# Patient Record
Sex: Female | Born: 1990 | Hispanic: Yes | Marital: Single | Smoking: Current every day smoker
Health system: Southern US, Community
[De-identification: ages and names within clinical notes are randomized; demographics above are authoritative.]

## PROBLEM LIST (undated history)

## (undated) DIAGNOSIS — K8012 Calculus of gallbladder with acute and chronic cholecystitis without obstruction: Secondary | ICD-10-CM

## (undated) DIAGNOSIS — K219 Gastro-esophageal reflux disease without esophagitis: Secondary | ICD-10-CM

## (undated) HISTORY — PX: DILATION AND CURETTAGE OF UTERUS: SHX78

## (undated) HISTORY — DX: Calculus of gallbladder with acute and chronic cholecystitis without obstruction: K80.12

## (undated) HISTORY — DX: Gastro-esophageal reflux disease without esophagitis: K21.9

## (undated) HISTORY — PX: KNEE SURGERY: SHX244

---

## 2014-11-27 ENCOUNTER — Encounter: Payer: Self-pay | Admitting: Emergency Medicine

## 2014-11-27 ENCOUNTER — Emergency Department
Admission: EM | Admit: 2014-11-27 | Discharge: 2014-11-27 | Disposition: A | Discharge status: Home / Self Care | Payer: BLUE CROSS/BLUE SHIELD | Attending: Emergency Medicine | Admitting: Emergency Medicine

## 2014-11-27 DIAGNOSIS — Z720 Tobacco use: Secondary | ICD-10-CM | POA: Insufficient documentation

## 2014-11-27 DIAGNOSIS — R309 Painful micturition, unspecified: Secondary | ICD-10-CM | POA: Diagnosis present

## 2014-11-27 DIAGNOSIS — Z3202 Encounter for pregnancy test, result negative: Secondary | ICD-10-CM | POA: Diagnosis not present

## 2014-11-27 DIAGNOSIS — N390 Urinary tract infection, site not specified: Secondary | ICD-10-CM | POA: Insufficient documentation

## 2014-11-27 LAB — URINALYSIS COMPLETE WITH MICROSCOPIC (ARMC ONLY)
BACTERIA UA: NONE SEEN
BILIRUBIN URINE: NEGATIVE
GLUCOSE, UA: NEGATIVE mg/dL
Ketones, ur: NEGATIVE mg/dL
NITRITE: NEGATIVE
Protein, ur: 100 mg/dL — AB
Specific Gravity, Urine: 1.018 (ref 1.005–1.030)
pH: 8 (ref 5.0–8.0)

## 2014-11-27 LAB — POCT PREGNANCY, URINE: PREG TEST UR: NEGATIVE

## 2014-11-27 MED ORDER — NITROFURANTOIN MONOHYD MACRO 100 MG PO CAPS: 100.0000 mg | ORAL_CAPSULE | Freq: Two times a day (BID) | ORAL | Status: DC

## 2014-11-27 MED ORDER — PHENAZOPYRIDINE HCL 200 MG PO TABS: 200.0000 mg | ORAL_TABLET | Freq: Three times a day (TID) | ORAL | Status: DC | PRN

## 2014-11-27 NOTE — Discharge Instructions (Signed)
Infeccin urinaria  (Urinary Tract Infection)  La infeccin urinaria puede ocurrir en Corporate treasurer del tracto urinario. El tracto urinario es un sistema de drenaje del cuerpo por el que se eliminan los desechos y el exceso de [place]. El tracto urinario est formado por dos riones, dos urteres, la vejiga y Engineer, mining. Los riones son rganos que tienen forma de frijol. Cada rin tiene aproximadamente el tamao del puo. Estn situados debajo de las [place], uno a cada lado de la columna vertebral CAUSAS  La causa de la infeccin son los microbios, que son organismos microscpicos, que incluyen hongos, virus, y bacterias. Estos organismos son tan pequeos que slo pueden verse a travs del microscopio. Las bacterias son los microorganismos que ms comnmente causan infecciones urinarias.  SNTOMAS  Los sntomas pueden variar segn la edad y el sexo del paciente y por la ubicacin de la infeccin. Los sntomas en las mujeres jvenes incluyen la necesidad frecuente e intensa de orinar y una sensacin dolorosa de ardor en la vejiga o en la uretra durante la miccin. Las mujeres y los hombres mayores podrn sentir cansancio, temblores y debilidad y Futures trader musculares y Engineer, mining abdominal. Si tiene [place], puede significar que la infeccin est en los riones. Otros sntomas son dolor en la espalda o en los lados debajo de las [place], nuseas y vmitos.  DIAGNSTICO  Para diagnosticar una infeccin urinaria, el mdico le preguntar acerca de sus sntomas. Genuine Parts una [place] de [place]. La muestra de orina se analiza para Engineer, manufacturing bacterias y glbulos blancos de Risk manager. Los glbulos blancos se forman en el organismo para ayudar a Artist las infecciones.  TRATAMIENTO  Por lo general, las infecciones urinarias pueden tratarse con medicamentos. Debido a que la [employer] de las infecciones son causadas por bacterias, por lo general pueden tratarse con antibiticos. La eleccin del  antibitico y la duracin del tratamiento depender de sus sntomas y el tipo de bacteria causante de la infeccin.  INSTRUCCIONES PARA EL CUIDADO EN EL HOGAR   Si le recetaron antibiticos, tmelos exactamente como su mdico le indique. Termine el medicamento aunque se sienta mejor despus de haber tomado slo algunos.  Beba gran cantidad de lquido para mantener la orina de tono claro o color amarillo plido.  Evite la cafena, el t y las [address]. Estas sustancias irritan la vejiga.  Vaciar la vejiga con frecuencia. Evite retener la orina durante largos perodos.  Vace la vejiga antes y despus de Management consultant.  Despus de mover el intestino, las mujeres deben higienizarse la regin perineal desde adelante hacia atrs. Use slo un papel tissue por vez. SOLICITE ATENCIN MDICA SI:   Siente dolor en la espalda.  Le sube la fiebre.  Los sntomas no mejoran luego de [address]. SOLICITE ATENCIN MDICA DE INMEDIATO SI:   Siente dolor intenso en la espalda o en la zona inferior del abdomen.  Comienza a sentir escalofros.  Tiene nuseas o vmitos.  Tiene una sensacin continua de quemazn o molestias al [employer]. ASEGRESE DE QUE:   Comprende estas instrucciones.  Controlar su enfermedad.  Solicitar ayuda de inmediato si no mejora o empeora.   Esta informacin no tiene Theme park manager el consejo del mdico. Asegrese de hacerle al mdico cualquier pregunta que tenga.   Document Released: 10/22/2004 Document Revised: 10/07/2011 Elsevier Interactive Patient Education [employer]  Take the prescription meds as directed.  Increase fluid intake to flush your bladder. Take regular bathroom breaks. [facility] as needed.

## 2014-11-27 NOTE — ED Notes (Signed)
Pt alert and oriented X4, active, cooperative, pt in NAD. RR even and unlabored, color WNL.  Pt informed to return if any life threatening symptoms occur.   

## 2014-11-27 NOTE — ED Notes (Signed)
Pt to ED with c/o painful urination that started today, currently on MP

## 2014-11-27 NOTE — ED Provider Notes (Signed)
[facility] Emergency Department Provider Note ____________________________________________  Time seen: 1552  I have reviewed the triage vital signs and the nursing notes.  HISTORY  Chief Complaint  painful urination   HPI Maria Hansen is a 24 y.o. female was at the ED for evaluation of sudden onset of dysuria today. She reports pain and burning just after she empties her bladder. She does also notes some lower pelvic pain, but denies any flank pain. She also denies any fevers, chills, sweats, nausea, vomiting, or hematuria. She has dosed Tylenol intermittently for symptom relief. She reports her LMP@10 /29/16, and notes it is normal.  History reviewed. No pertinent past medical history.  There are no active problems to display for this patient.  History reviewed. No pertinent past surgical history.  Current Outpatient Rx  Name  Route  Sig  Dispense  Refill  . nitrofurantoin, macrocrystal-monohydrate, (MACROBID) 100 MG capsule   Oral   Take 1 capsule (100 mg total) by mouth 2 (two) times daily.   14 capsule   0   . phenazopyridine (PYRIDIUM) 200 MG tablet   Oral   Take 1 tablet (200 mg total) by mouth 3 (three) times daily as needed for pain.   20 tablet   0     Allergies Review of patient's allergies indicates no known allergies.  No family history on file.  Social History Social History  Substance Use Topics  . Smoking status: Current Every Day Smoker  . Smokeless tobacco: None  . Alcohol Use: Yes   Review of Systems  Constitutional: Negative for fever. Eyes: Negative for visual changes. ENT: Negative for sore throat. Cardiovascular: Negative for chest pain. Respiratory: Negative for shortness of breath. Gastrointestinal: Negative for abdominal pain, vomiting and diarrhea. Genitourinary: Positive for dysuria. Musculoskeletal: Negative for back pain. Skin: Negative for rash. Neurological: Negative for headaches, focal weakness or  numbness. ____________________________________________  PHYSICAL EXAM:  VITAL SIGNS: ED Triage Vitals  Enc Vitals Group     BP 11/27/14 1541 116/79 mmHg     Pulse Rate 11/27/14 1541 69     Resp 11/27/14 1541 18     Temp 11/27/14 1541 98.3 F (36.8 C)     Temp Source 11/27/14 1541 Oral     SpO2 11/27/14 1541 100 %     Weight 11/27/14 1541 146 lb (66.225 kg)     Height 11/27/14 1541  (1.575 m)     Head Cir --      Peak Flow --      Pain Score 11/27/14 1542 5     Pain Loc --      Pain Edu? --      Excl. in GC? --    Constitutional: Alert and oriented. Well appearing and in no distress. Head: Normocephalic and atraumatic.      Eyes: Conjunctivae are normal. PERRL. Normal extraocular movements      Ears: Canals clear. TMs intact bilaterally.   Nose: No congestion/rhinorrhea.   Mouth/Throat: Mucous membranes are moist.   Neck: Supple. No thyromegaly. Hematological/Lymphatic/Immunological: No cervical lymphadenopathy. Cardiovascular: Normal rate, regular rhythm.  Respiratory: Normal respiratory effort. No wheezes/rales/rhonchi. Gastrointestinal: Soft and nontender. No distention. Musculoskeletal: Nontender with normal range of motion in all extremities.  Neurologic:  Normal gait without ataxia. Normal speech and language. No gross focal neurologic deficits are appreciated. Skin:  Skin is warm, dry and intact. No rash noted. Psychiatric: Mood and affect are normal. Patient exhibits appropriate insight and judgment. ____________________________________________   LABS (pertinent  positives/negatives) Labs Reviewed  URINALYSIS COMPLETEWITH MICROSCOPIC ([facility] ONLY) - Abnormal; Notable for the following:    Color, Urine YELLOW (*)    APPearance TURBID (*)    Hgb urine dipstick 2+ (*)    Protein, ur 100 (*)    Leukocytes, UA 3+ (*)    Squamous Epithelial / LPF 0-5 (*)    All other components within normal limits  URINE CULTURE  POCT PREGNANCY, URINE   ____________________________________________  INITIAL IMPRESSION / ASSESSMENT AND PLAN / ED COURSE  Patient with laboratory confirmation of an acute cystitis. She will be treated with Macrobid, and phenazopyridine. Urine culture is pending. [facility]. Work note provided for today, as requested.  ____________________________________________  FINAL CLINICAL IMPRESSION(S) / ED DIAGNOSES  Final diagnoses:  UTI (lower urinary tract infection)      Lissa HoardJenise V Bacon Layman Gully, PA-C 11/27/14 1650  Emily FilbertJonathan E Williams, MD 11/27/14 2141

## 2014-11-29 LAB — URINE CULTURE
Culture: >=100000
SPECIAL REQUESTS: NORMAL

## 2015-03-05 ENCOUNTER — Emergency Department
Admission: EM | Admit: 2015-03-05 | Discharge: 2015-03-05 | Disposition: A | Discharge status: Home / Self Care | Payer: BLUE CROSS/BLUE SHIELD | Attending: Emergency Medicine | Admitting: Emergency Medicine

## 2015-03-05 ENCOUNTER — Encounter: Payer: Self-pay | Admitting: Emergency Medicine

## 2015-03-05 DIAGNOSIS — B349 Viral infection, unspecified: Secondary | ICD-10-CM

## 2015-03-05 DIAGNOSIS — Z79899 Other long term (current) drug therapy: Secondary | ICD-10-CM | POA: Insufficient documentation

## 2015-03-05 LAB — RAPID INFLUENZA A&B ANTIGENS (ARMC ONLY): INFLUENZA A (ARMC): NOT DETECTED

## 2015-03-05 LAB — RAPID INFLUENZA A&B ANTIGENS: Influenza B (ARMC): NOT DETECTED

## 2015-03-05 MED ORDER — PROMETHAZINE-DM 6.25-15 MG/5ML PO SYRP: 5.0000 mL | ORAL_SOLUTION | Freq: Four times a day (QID) | ORAL | Status: DC | PRN

## 2015-03-05 NOTE — ED Notes (Signed)
Pt to ed with c/o cough, congestion, body aches, fever x 3 days.

## 2015-03-05 NOTE — ED Notes (Signed)
Pt c/o flu like S/S for 3 days.  Pt sts nausea began today.  Pt denies SOB, CP or LOC. NAD.

## 2015-03-05 NOTE — Discharge Instructions (Signed)
Infecciones virales °(Viral Infections) °La causa de las infecciones virales son diferentes tipos de virus. La mayoría de las infecciones virales no son graves y se curan solas. Sin embargo, algunas infecciones pueden provocar síntomas graves y causar complicaciones.  °SÍNTOMAS °Las infecciones virales ocasionan:  °· Dolores de garganta. °· Molestias. °· Dolor de cabeza. °· Mucosidad nasal. °· Diferentes tipos de erupción. °· Lagrimeo. °· Cansancio. °· Tos. °· Pérdida del apetito. °· Infecciones gastrointestinales que producen náuseas, vómitos y diarrea. °Estos síntomas no responden a los antibióticos porque la infección no es por bacterias. Sin embargo, puede sufrir una infección bacteriana luego de la infección viral. Se denomina sobreinfección. Los síntomas de esta infección bacteriana son:  °· Empeora el dolor en la garganta con pus y dificultad para tragar. °· Ganglios hinchados en el cuello. °· Escalofríos y fiebre muy elevada o persistente. °· Dolor de cabeza intenso. °· Sensibilidad en los senos paranasales. °· Malestar (sentirse enfermo) general persistente, dolores musculares y fatiga (cansancio). °· Tos persistente. °· Producción mucosa con la tos, de color amarillo, verde o marrón. °INSTRUCCIONES PARA EL CUIDADO DOMICILIARIO °· Solo tome medicamentos que se pueden comprar sin receta o recetados para el dolor, malestar, la diarrea o la fiebre, como le indica el médico. °· Beba gran cantidad de líquido para mantener la orina de tono claro o color amarillo pálido. Las bebidas deportivas proporcionan electrolitos,azúcares e hidratación. °· Descanse lo suficiente y aliméntese bien. Puede tomar sopas y caldos con crackers o arroz. °SOLICITE ATENCIÓN MÉDICA DE INMEDIATO SI: °· Tiene dolor de cabeza, le falta el aire, siente dolor en el pecho, en el cuello o aparece una erupción. °· Tiene vómitos o diarrea intensos y no puede retener líquidos. °· Usted o su niño tienen una temperatura oral de más de 38,9° C  (102° F) y no puede controlarla con medicamentos. °· Su bebé tiene más de 3 meses y su temperatura rectal es de 102° F (38.9° C) o más. °· Su bebé tiene 3 meses o menos y su temperatura rectal es de 100.4° F (38° C) o más. °ESTÉ SEGURO QUE:  °· Comprende las instrucciones para el alta médica. °· Controlará su enfermedad. °· Solicitará atención médica de inmediato según las indicaciones. °  °Esta información no tiene como fin reemplazar el consejo del médico. Asegúrese de hacerle al médico cualquier pregunta que tenga. °  °Document Released: 10/22/2004 Document Revised: 04/06/2011 °Elsevier Interactive Patient Education ©2016 Elsevier Inc. ° °

## 2015-03-05 NOTE — ED Provider Notes (Signed)
[facility] Emergency Department Provider Note  ____________________________________________  Time seen: Approximately 5:09 PM  I have reviewed the triage vital signs and the nursing notes.   HISTORY  Chief Complaint Cough; Generalized Body Aches; and Fever    HPI Maria Hansen is a 25 y.o. female, NAD, presents to the ED with 2 day history of sudden onset cough, body aches, fever, chills. Notes she's had sore throat, headache and mild nausea. No episodes of emesis. Denies abdominal pain, back pain, chest pain. Notes everyone in her household is been ill and has been on antibiotics recently. Denies any exposure to strep or fluid this time. Last menstrual was 02/14/2015.   History reviewed. No pertinent past medical history.  There are no active problems to display for this patient.   History reviewed. No pertinent past surgical history.  Current Outpatient Rx  Name  Route  Sig  Dispense  Refill  . nitrofurantoin, macrocrystal-monohydrate, (MACROBID) 100 MG capsule   Oral   Take 1 capsule (100 mg total) by mouth 2 (two) times daily.   14 capsule   0   . phenazopyridine (PYRIDIUM) 200 MG tablet   Oral   Take 1 tablet (200 mg total) by mouth 3 (three) times daily as needed for pain.   20 tablet   0   . promethazine-dextromethorphan (PROMETHAZINE-DM) 6.25-15 MG/5ML syrup   Oral   Take 5 mLs by mouth 4 (four) times daily as needed for cough.   118 mL   0     Allergies Review of patient's allergies indicates no known allergies.  History reviewed. No pertinent family history.  Social History Social History  Substance Use Topics  . Smoking status: Never Smoker   . Smokeless tobacco: None  . Alcohol Use: No     Review of Systems  Constitutional: Positive fever, chills. Fatigue Eyes: No visual changes. No discharge ENT: Positive sore throat. Negative for nasal congestion, sinus pressure, ear pain. Cardiovascular: No chest  pain. Respiratory: Positive cough and chest congestion. No shortness of breath or wheezing.  Gastrointestinal: Positive nausea without vomiting. No abdominal pain.  No use in bowel habits. Genitourinary: Negative for dysuria. No hematuria. No urinary hesitancy, urgency or increased frequency. Musculoskeletal: Negative for back pain. Positive general myalgias Skin: Negative for rash. Neurological: Negative for headaches, focal weakness or numbness. 10-point ROS otherwise negative.  ____________________________________________   PHYSICAL EXAM:  VITAL SIGNS: ED Triage Vitals  Enc Vitals Group     BP 03/05/15 1657 129/78 mmHg     Pulse Rate 03/05/15 1657 85     Resp 03/05/15 1657 20     Temp 03/05/15 1657 99.2 F (37.3 C)     Temp Source 03/05/15 1657 Oral     SpO2 03/05/15 1657 100 %     Weight 03/05/15 1657 146 lb (66.225 kg)     Height 03/05/15 1657  (1.6 m)     Head Cir --      Peak Flow --      Pain Score 03/05/15 1657 8     Pain Loc --      Pain Edu? --      Excl. in GC? --     Constitutional: Alert and oriented. Well appearing and in no acute distress. Eyes: Conjunctivae are normal. PERRL. EOMI without pain.  Head: Atraumatic. ENT:      Ears: TMs visualized bilaterally without erythema, effusion, bulging, retraction. Bilateral external ear canals without swelling, erythema, discharge  Nose: No congestion/rhinnorhea.      Mouth/Throat: Mucous membranes are moist.  Neck: Upper with full range of motion Hematological/Lymphatic/Immunilogical: No cervical lymphadenopathy. Cardiovascular: Normal rate, regular rhythm. Normal S1 and S2.  Respiratory: Normal respiratory effort without tachypnea or retractions. Lungs CTAB. Neurologic:  Normal speech and language. No gross focal neurologic deficits are appreciated.  Skin:  Skin is warm, dry and intact. No rash noted. Psychiatric: Mood and affect are normal. Speech and behavior are normal. Patient exhibits appropriate  insight and judgement.   ____________________________________________   LABS (all labs ordered are listed, but only abnormal results are displayed)  Labs Reviewed  RAPID INFLUENZA A&B ANTIGENS ([facility] ONLY)   ____________________________________________  EKG  None ____________________________________________  RADIOLOGY  None ____________________________________________    PROCEDURES  Procedure(s) performed: None    Medications - No data to display   ____________________________________________   INITIAL IMPRESSION / ASSESSMENT AND PLAN / ED COURSE  Pertinent lab results that were available during my care of the patient were reviewed by me and considered in my medical decision making (see chart for details). Rapid strep and flu both returned negative.  Patient's diagnosis is consistent with viral infection. Patient will be discharged home with prescriptions for promethazine DM to decrease cough congestion as well as control any nausea. May alternate Tylenol and ibuprofen as needed for fever and aches. Advise increasing fluid intake to stay hydrated. 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 Patient is to follow up with [facility] if symptoms persist past this treatment course. Patient is given ED precautions to return to the ED for any worsening or new symptoms.    ____________________________________________  FINAL CLINICAL IMPRESSION(S) / ED DIAGNOSES  Final diagnoses:  Viral infection      NEW MEDICATIONS STARTED DURING THIS VISIT:  New Prescriptions   PROMETHAZINE-DEXTROMETHORPHAN (PROMETHAZINE-DM) 6.25-15 MG/5ML SYRUP    Take 5 mLs by mouth 4 (four) times daily as needed for cough.         Hope Pigeon, PA-C 03/05/15 1912  Myrna Blazer, MD 03/05/15 2226

## 2015-07-11 DIAGNOSIS — N96 Recurrent pregnancy loss: Secondary | ICD-10-CM | POA: Insufficient documentation

## 2016-06-28 ENCOUNTER — Emergency Department
Admission: EM | Admit: 2016-06-28 | Discharge: 2016-06-28 | Disposition: A | Discharge status: Home / Self Care | Payer: BLUE CROSS/BLUE SHIELD | Attending: Student in an Organized Health Care Education/Training Program | Admitting: Student in an Organized Health Care Education/Training Program

## 2016-06-28 ENCOUNTER — Encounter: Payer: Self-pay | Admitting: Emergency Medicine

## 2016-06-28 DIAGNOSIS — Y999 Unspecified external cause status: Secondary | ICD-10-CM | POA: Insufficient documentation

## 2016-06-28 DIAGNOSIS — Y939 Activity, unspecified: Secondary | ICD-10-CM | POA: Insufficient documentation

## 2016-06-28 DIAGNOSIS — S025XXA Fracture of tooth (traumatic), initial encounter for closed fracture: Secondary | ICD-10-CM | POA: Insufficient documentation

## 2016-06-28 DIAGNOSIS — Y929 Unspecified place or not applicable: Secondary | ICD-10-CM | POA: Insufficient documentation

## 2016-06-28 DIAGNOSIS — K0889 Other specified disorders of teeth and supporting structures: Secondary | ICD-10-CM

## 2016-06-28 DIAGNOSIS — X58XXXA Exposure to other specified factors, initial encounter: Secondary | ICD-10-CM | POA: Insufficient documentation

## 2016-06-28 MED ORDER — NAPROXEN 500 MG PO TABS: 500.0000 mg | ORAL_TABLET | Freq: Two times a day (BID) | ORAL | 0 refills | Status: AC

## 2016-06-28 MED ORDER — AMOXICILLIN 500 MG PO CAPS: 500.0000 mg | ORAL_CAPSULE | Freq: Three times a day (TID) | ORAL | 0 refills | Status: AC

## 2016-06-28 MED ORDER — HYDROCODONE-ACETAMINOPHEN 5-325 MG PO TABS
1.0000 | ORAL_TABLET | Freq: Once | ORAL | Status: AC
  Administered 2016-06-28: 1 via ORAL
  Filled 2016-06-28: qty 1

## 2016-06-28 MED ORDER — BUPIVACAINE HCL (PF) 0.5 % IJ SOLN
30.0000 mL | Freq: Once | INTRAMUSCULAR | Status: AC
  Administered 2016-06-28: 30 mL
  Filled 2016-06-28: qty 30

## 2016-06-28 NOTE — Discharge Instructions (Signed)
OPTIONS FOR DENTAL FOLLOW UP CARE ° °[place] Department of Health and Human Services - Local Safety Net Dental Clinics °http://www.ncdhhs.gov/dph/oralhealth/services/safetynetclinics.htm °  °[facility] (336-562-3123) ° °[facility] [place] ([phone]) ° °[facility] ([phone] ext [phone]) ° °[facility] ([phone]) ° °[facility] ([phone]) °This clinic caters to the indigent population and is on a lottery system. °Location: °[facility], [address], [place] °Clinic Hours: °Wednesdays from 6pm - 9pm, patients seen by a lottery system. °For dates, call or go to [email or web address]/[email or web address] °Services: °Cleanings, fillings and simple extractions. °Payment Options: °DENTAL WORK IS FREE OF CHARGE. Bring proof of income or support. °Best way to get seen: °Arrive at 5:15 pm - this is a lottery, NOT first come/first serve, so arriving earlier will not increase your chances of being seen. °  °  °[facility] °[phone] °Select option 1 for emergencies °  °Location: °[facility], [address], [place] °Clinic Hours: °No walk-ins accepted - call the day before to schedule an appointment. °Check in times are 9:30 am and 1:30 pm. °Services: °Simple extractions, temporary fillings, pulpectomy/pulp debridement, uncomplicated abscess drainage. °Payment Options: °PAYMENT IS DUE AT THE TIME OF SERVICE.  Fee is usually $100-200, additional surgical procedures (e.g. abscess drainage) may be extra. °Cash, checks, Visa/MasterCard accepted.  Can file Medicaid if patient is covered for dental - patient should call case worker to check. °No discount for [facility] patients. °Best way to get seen: °MUST call the day before and get onto the schedule. Can usually be seen the next 1-2 days. No walk-ins accepted. °  °  °[facility] °[phone] °   °Location: °[facility], [address], [place] °Clinic Hours: °M, W, Th, F 8am or 1:30pm, Tues 9a or 1:30 - first come/first served. °Services: °Simple extractions, temporary fillings, uncomplicated abscess drainage.  You do not need to be an [place] resident. °Payment Options: °PAYMENT IS DUE AT THE TIME OF SERVICE. °Dental insurance, otherwise sliding scale - bring proof of income or support. °Depending on income and treatment needed, cost is usually $50-200. °Best way to get seen: °Arrive early as it is first come/first served. °  °  °[facility] °[phone] °  °Location: °[address] °Clinic Hours: °Mon-Thu 8a-5p °Services: °Most basic dental services including extractions and fillings. °Payment Options: °PAYMENT IS DUE AT THE TIME OF SERVICE. °Sliding scale, up to 50% off - bring proof if income or support. °Medicaid with dental option accepted. °Best way to get seen: °Call to schedule an appointment, can usually be seen within 2 weeks OR they will try to see walk-ins - show up at 8a or 2p (you may have to wait). °  °  °[facility] °919-245-2435 °[place] RESIDENTS ONLY °  °Location: °[facility], [address], [place], [place] [address] °Clinic Hours: By appointment only. °Monday - Thursday 8am-5pm, Friday 8am-12pm °Services: Cleanings, fillings, extractions. °Payment Options: °PAYMENT IS DUE AT THE TIME OF SERVICE. °Cash, Visa or MasterCard. Sliding scale - $30 minimum per service. °Best way to get seen: °Come in to office, complete packet and make an appointment - need proof of income °or support monies for each household member and proof of [place] residence. °Usually takes about a month to get in. °  °  °[facility] °[phone] °  °Location: °[address]   [place] °Clinic Hours: Walk-in Urgent Care Dental Services are offered Monday-Friday  mornings only. °The numbers of emergencies accepted daily is limited to the number of °providers available. °Maximum 15 - Mondays, Wednesdays & Thursdays °Maximum 10 - Tuesdays & Fridays °Services: °You do not need to be a [place] resident to be seen for a dental emergency. °Emergencies are defined as pain, swelling, abnormal bleeding, or dental trauma. Walkins will receive x-rays if needed. °NOTE: Dental cleaning is not an emergency. °Payment Options: °PAYMENT IS DUE AT THE TIME OF SERVICE. °Minimum co-pay is $40.00 for uninsured patients. °Minimum co-pay is $3.00 for Medicaid with dental coverage. °Dental Insurance is accepted and must be presented at time of visit. °Medicare does not cover dental. °Forms of payment: Cash, credit card, checks. °Best way to get seen: °If not previously registered with the clinic, walk-in dental registration begins at 7:15 am and is on a first come/first serve basis. °If previously registered with the clinic, call to make an appointment. °  °  °[facility] °[phone] °[place] RESIDENTS ONLY °  °Location: °[address], [place], [place] °Clinic Hours: °Mon-Thu 10a-2p °Services: Extractions only! °Payment Options: °FREE (donations accepted) - bring proof of income or support °Best way to get seen: °Call and schedule an appointment OR come at 8am on the 1st Monday of every month (except for holidays) when it is first come/first served. °  °  °[facility] °[phone] °  °Location: °[address], [place] °Clinic Hours: °Friday mornings °Services, Payment Options, Best way to get seen: °Call for info °

## 2016-06-28 NOTE — ED Provider Notes (Signed)
[facility] Emergency Department Provider Note    First MD Initiated Contact with Patient 06/28/16 1739     (approximate)  I have reviewed the triage vital signs and the nursing notes.   HISTORY  Chief Complaint Dental Pain    HPI Maria Hansen is a 26 y.o. female acute severe dental pain and cheek pain. Patient states she broke her right upper tooth earlier this week. States that it is very sharp and has been irritating her cheek line. States that she's been unable to eat much due to severe hot and cold sensitivity. rates pain as 10 out of 10 in severity. No improvement with Tylenol. Denies any trouble swallowing. No shortness of breath. No trouble speaking.   History reviewed. No pertinent past medical history. History reviewed. No pertinent family history. History reviewed. No pertinent surgical history. There are no active problems to display for this patient.     Prior to Admission medications   Medication Sig Start Date End Date Taking? Authorizing Provider  nitrofurantoin, macrocrystal-monohydrate, (MACROBID) 100 MG capsule Take 1 capsule (100 mg total) by mouth 2 (two) times daily. 11/27/14   Menshew, Charlesetta IvoryJenise V Bacon, PA-C  phenazopyridine (PYRIDIUM) 200 MG tablet Take 1 tablet (200 mg total) by mouth 3 (three) times daily as needed for pain. 11/27/14   Menshew, Charlesetta IvoryJenise V Bacon, PA-C  promethazine-dextromethorphan (PROMETHAZINE-DM) 6.25-15 MG/5ML syrup Take 5 mLs by mouth 4 (four) times daily as needed for cough. 03/05/15   Hagler, Jami L, PA-C    Allergies Patient has no known allergies.    Social History Social History  Substance Use Topics  . Smoking status: Never Smoker  . Smokeless tobacco: Never Used  . Alcohol use No    Review of Systems Patient denies headaches, rhinorrhea, blurry vision, numbness, shortness of breath, chest pain, edema, cough, abdominal pain, nausea, vomiting, diarrhea, dysuria, fevers, rashes or hallucinations  unless otherwise stated above in HPI. ____________________________________________   PHYSICAL EXAM:  VITAL SIGNS: There were no vitals filed for this visit.  Constitutional: Alert and oriented. Well appearing and in no acute distress. Eyes: Conjunctivae are normal.  Head: Atraumatic. Nose: No congestion/rhinnorhea. Mouth/Throat: Mucous membranes are moist.  Rennis HardingEllis 3 fracture to tooth 1 Neck: No stridor. Painless ROM.  Cardiovascular: Normal rate, regular rhythm. Grossly normal heart sounds.  Good peripheral circulation. Respiratory: Normal respiratory effort.  No retractions. Lungs CTAB. Gastrointestinal: Soft. No distention or swelling Musculoskeletal: MAE spontaneously Neurologic:  Normal speech and language. No gross focal neurologic deficits are appreciated. No facial droop Skin:  Skin is warm, dry and intact. No rash noted. Psychiatric: Mood and affect are normal. Speech and behavior are normal.  ____________________________________________   LABS (all labs ordered are listed, but only abnormal results are displayed)  No results found for this or any previous visit (from the past 24 hour(s)). ____________________________________________ ____________________________________________   PROCEDURES  Procedure(s) performed:  Dental Block Date/Time: 06/28/2016 6:16 PM Performed by: Willy EddyOBINSON, Adriauna Campton Authorized by: Willy EddyOBINSON, Iriel Nason   Consent:    Consent obtained:  Verbal   Consent given by:  Patient   Alternatives discussed:  No treatment Indications:    Indications: dental pain and dentoalveolar trauma   Location:    Block type:  Posterior superior alveolar   Laterality:  Right Procedure details (see MAR for exact dosages):    Needle gauge:  25 G   Anesthetic injected:  Bupivacaine 0.5% w/o epi   Injection procedure:  Anatomic landmarks identified, introduced needle, incremental injection, negative  aspiration for blood and anatomic landmarks palpated Post-procedure  details:    Outcome:  Anesthesia achieved   Patient tolerance of procedure:  Tolerated well, no immediate complications      Critical Care performed: no ____________________________________________   INITIAL IMPRESSION / ASSESSMENT AND PLAN / ED COURSE  Pertinent labs & imaging results that were available during my care of the patient were reviewed by me and considered in my medical decision making (see chart for details).  DDX: abscess, dental fracture, dental caries,   Jerni Selmer is a 26 y.o. who p/w dental pain last several days as described above. No systemic symptoms. No fevers. Afebrile in ED. VSS. Exam as above. Poor dentition. Likely dental caries causing weak teeth and subsequent dental fracture with area of mucosal iritation from sharp tooth edge. No external focal drainable dental abscess identified. No evidence of Ludwig's, buccal cellulitis, mastoiditis, or airway compromise. Will treat pt with ABX, pain medication and dental referral. Dental block performed with adequate analgesia. Sharp area from tooth capped with temerrex.  Low cost dental options handout provided to pt.       ____________________________________________   FINAL CLINICAL IMPRESSION(S) / ED DIAGNOSES  Final diagnoses:  Closed fracture of tooth, initial encounter  Pain, dental      NEW MEDICATIONS STARTED DURING THIS VISIT:  New Prescriptions   No medications on file     Note:  This document was prepared using Dragon voice recognition software and may include unintentional dictation errors.    Willy Eddy, MD 06/28/16 [id number]

## 2016-06-28 NOTE — ED Triage Notes (Signed)
Dental pain on the right side "all day" pt states she has been taking Tylenol today with no relief.

## 2017-10-12 ENCOUNTER — Emergency Department
Admission: EM | Admit: 2017-10-12 | Discharge: 2017-10-12 | Disposition: A | Discharge status: Home / Self Care | Payer: Commercial Managed Care - PPO | Attending: Emergency Medicine | Admitting: Emergency Medicine

## 2017-10-12 ENCOUNTER — Emergency Department: Payer: Commercial Managed Care - PPO

## 2017-10-12 ENCOUNTER — Encounter: Payer: Self-pay | Admitting: Emergency Medicine

## 2017-10-12 ENCOUNTER — Other Ambulatory Visit: Payer: Self-pay

## 2017-10-12 ENCOUNTER — Ambulatory Visit: Payer: Commercial Managed Care - PPO | Admitting: Surgery

## 2017-10-12 ENCOUNTER — Encounter: Payer: Self-pay | Admitting: Surgery

## 2017-10-12 VITALS — BP 118/84 | HR 84 | Temp 97.7°F | Resp 12 | Ht 62.0 in | Wt 169.0 lb

## 2017-10-12 DIAGNOSIS — K811 Chronic cholecystitis: Secondary | ICD-10-CM

## 2017-10-12 DIAGNOSIS — K8020 Calculus of gallbladder without cholecystitis without obstruction: Secondary | ICD-10-CM

## 2017-10-12 DIAGNOSIS — R1010 Upper abdominal pain, unspecified: Secondary | ICD-10-CM | POA: Diagnosis not present

## 2017-10-12 DIAGNOSIS — R1013 Epigastric pain: Secondary | ICD-10-CM | POA: Diagnosis present

## 2017-10-12 DIAGNOSIS — R1011 Right upper quadrant pain: Secondary | ICD-10-CM | POA: Diagnosis not present

## 2017-10-12 DIAGNOSIS — K219 Gastro-esophageal reflux disease without esophagitis: Secondary | ICD-10-CM | POA: Diagnosis not present

## 2017-10-12 DIAGNOSIS — R52 Pain, unspecified: Secondary | ICD-10-CM

## 2017-10-12 LAB — CBC WITH DIFFERENTIAL/PLATELET
Basophils Absolute: 0.1 10*3/uL (ref 0–0.1)
Basophils Relative: 1 %
EOS ABS: 0.1 10*3/uL (ref 0–0.7)
Eosinophils Relative: 1 %
HCT: 39.5 % (ref 35.0–47.0)
HEMOGLOBIN: 13.8 g/dL (ref 12.0–16.0)
LYMPHS ABS: 2.3 10*3/uL (ref 1.0–3.6)
Lymphocytes Relative: 22 %
MCH: 29.5 pg (ref 26.0–34.0)
MCHC: 34.8 g/dL (ref 32.0–36.0)
MCV: 84.6 fL (ref 80.0–100.0)
MONOS PCT: 5 %
Monocytes Absolute: 0.5 10*3/uL (ref 0.2–0.9)
NEUTROS PCT: 73 %
Neutro Abs: 7.8 10*3/uL — ABNORMAL HIGH (ref 1.4–6.5)
Platelets: 317 10*3/uL (ref 150–440)
RBC: 4.67 MIL/uL (ref 3.80–5.20)
RDW: 13.2 % (ref 11.5–14.5)
WBC: 10.7 10*3/uL (ref 3.6–11.0)

## 2017-10-12 LAB — URINALYSIS, COMPLETE (UACMP) WITH MICROSCOPIC
BILIRUBIN URINE: NEGATIVE
Bacteria, UA: NONE SEEN
Glucose, UA: NEGATIVE mg/dL
Ketones, ur: NEGATIVE mg/dL
LEUKOCYTES UA: NEGATIVE
NITRITE: NEGATIVE
PH: 5 (ref 5.0–8.0)
Protein, ur: NEGATIVE mg/dL
SPECIFIC GRAVITY, URINE: 1.014 (ref 1.005–1.030)

## 2017-10-12 LAB — COMPREHENSIVE METABOLIC PANEL
ALK PHOS: 70 U/L (ref 38–126)
ALT: 22 U/L (ref 0–44)
ANION GAP: 5 (ref 5–15)
AST: 17 U/L (ref 15–41)
Albumin: 3.9 g/dL (ref 3.5–5.0)
BILIRUBIN TOTAL: 0.3 mg/dL (ref 0.3–1.2)
BUN: 12 mg/dL (ref 6–20)
CALCIUM: 8.5 mg/dL — AB (ref 8.9–10.3)
CO2: 27 mmol/L (ref 22–32)
Chloride: 105 mmol/L (ref 98–111)
Creatinine, Ser: 0.71 mg/dL (ref 0.44–1.00)
Glucose, Bld: 109 mg/dL — ABNORMAL HIGH (ref 70–99)
Potassium: 3.9 mmol/L (ref 3.5–5.1)
SODIUM: 137 mmol/L (ref 135–145)
TOTAL PROTEIN: 7.3 g/dL (ref 6.5–8.1)

## 2017-10-12 LAB — POCT PREGNANCY, URINE: Preg Test, Ur: NEGATIVE

## 2017-10-12 LAB — HCG, QUANTITATIVE, PREGNANCY: hCG, Beta Chain, Quant, S: <1 m[IU]/mL (ref <5)

## 2017-10-12 LAB — LIPASE, BLOOD: Lipase: 42 U/L (ref 11–51)

## 2017-10-12 MED ORDER — IBUPROFEN 600 MG PO TABS
600.0000 mg | ORAL_TABLET | Freq: Once | ORAL | Status: AC
  Administered 2017-10-12: 600 mg via ORAL
  Filled 2017-10-12: qty 1

## 2017-10-12 MED ORDER — IBUPROFEN 600 MG PO TABS: 600.0000 mg | ORAL_TABLET | Freq: Three times a day (TID) | ORAL | 0 refills | Status: DC | PRN

## 2017-10-12 MED ORDER — ONDANSETRON HCL 4 MG/2ML IJ SOLN
4.0000 mg | Freq: Once | INTRAMUSCULAR | Status: AC
  Administered 2017-10-12: 4 mg via INTRAVENOUS
  Filled 2017-10-12: qty 2

## 2017-10-12 MED ORDER — KETOROLAC TROMETHAMINE 30 MG/ML IJ SOLN
15.0000 mg | Freq: Once | INTRAMUSCULAR | Status: DC
Start: 2017-10-12 — End: 2017-10-12

## 2017-10-12 MED ORDER — HYDROCODONE-ACETAMINOPHEN 5-325 MG PO TABS: 1.0000 | ORAL_TABLET | Freq: Four times a day (QID) | ORAL | 0 refills | Status: DC | PRN

## 2017-10-12 MED ORDER — IOPAMIDOL (ISOVUE-300) INJECTION 61%
100.0000 mL | Freq: Once | INTRAVENOUS | Status: AC | PRN
  Administered 2017-10-12: 100 mL via INTRAVENOUS

## 2017-10-12 MED ORDER — ONDANSETRON 4 MG PO TBDP: 4.0000 mg | ORAL_TABLET | Freq: Three times a day (TID) | ORAL | 0 refills | Status: DC | PRN

## 2017-10-12 MED ORDER — FENTANYL CITRATE (PF) 100 MCG/2ML IJ SOLN: 75.0000 ug | Freq: Once | INTRAMUSCULAR | Status: DC

## 2017-10-12 MED ORDER — HYDROCODONE-ACETAMINOPHEN 5-325 MG PO TABS
1.0000 | ORAL_TABLET | Freq: Once | ORAL | Status: AC
  Administered 2017-10-12: 1 via ORAL
  Filled 2017-10-12: qty 1

## 2017-10-12 MED ORDER — SODIUM CHLORIDE 0.9 % IV BOLUS
1000.0000 mL | Freq: Once | INTRAVENOUS | Status: AC
  Administered 2017-10-12: 1000 mL via INTRAVENOUS

## 2017-10-12 NOTE — ED Provider Notes (Signed)
[facility] Emergency Department Provider Note  ____________________________________________   First MD Initiated Contact with Patient 10/12/17 0301     (approximate)  I have reviewed the triage vital signs and the nursing notes.   HISTORY  Chief Complaint Flank Pain   HPI Maria Hansen is a 27 y.o. female who comes the emergency department with an acute exacerbation of epigastric and left-sided flank pain.  She is had intermittent episodes for the past year or so of epigastric discomfort this seems to be radiating towards her back across her upper abdomen and more to the left than the right.  Tonight it became acutely worse.  Associated with some nausea and vomiting.  Not postprandial.  No fevers or chills.   Normal bowel movements and flatus.  No history of abdominal surgeries.  She has never been evaluated for this in the past.  No dysuria frequency or hesitancy.  Tonight the pain became so severe she could not sleep.  It is currently moderate in severity and aching.  Is mostly constant now.   History reviewed. No pertinent past medical history.  There are no active problems to display for this patient.   History reviewed. No pertinent surgical history.  Prior to Admission medications   Medication Sig Start Date End Date Taking? Authorizing Provider  HYDROcodone-acetaminophen (NORCO) 5-325 MG tablet Take 1 tablet by mouth every 6 (six) hours as needed for up to 7 doses for severe pain. 10/12/17   Merrily Brittleifenbark, Joelle [place], MD  ibuprofen (ADVIL,MOTRIN) 600 MG tablet Take 1 tablet (600 mg total) by mouth every 8 (eight) hours as needed. 10/12/17   Merrily Brittleifenbark, Gracelyn [place], MD  nitrofurantoin, macrocrystal-monohydrate, (MACROBID) 100 MG capsule Take 1 capsule (100 mg total) by mouth 2 (two) times daily. 11/27/14   Menshew, Charlesetta IvoryJenise V Bacon, PA-C  ondansetron (ZOFRAN ODT) 4 MG disintegrating tablet Take 1 tablet (4 mg total) by mouth every 8 (eight) hours as needed for nausea or  vomiting. 10/12/17   Merrily Brittleifenbark, Rebeccah Ivins, MD  phenazopyridine (PYRIDIUM) 200 MG tablet Take 1 tablet (200 mg total) by mouth 3 (three) times daily as needed for pain. 11/27/14   Menshew, Charlesetta IvoryJenise V Bacon, PA-C  promethazine-dextromethorphan (PROMETHAZINE-DM) 6.25-15 MG/5ML syrup Take 5 mLs by mouth 4 (four) times daily as needed for cough. 03/05/15   Hagler, Jami L, PA-C    Allergies Patient has no known allergies.  No family history on file.  Social History Social History   Tobacco Use  . Smoking status: Never Smoker  . Smokeless tobacco: Never Used  Substance Use Topics  . Alcohol use: No  . Drug use: No    Review of Systems Constitutional: No fever/chills Eyes: No visual changes. ENT: No sore throat. Cardiovascular: Denies chest pain. Respiratory: Denies shortness of breath. Gastrointestinal: Positive for abdominal pain.  Positive for nausea.  Positive for vomiting.  Negative for diarrhea. Genitourinary: Negative for dysuria. Musculoskeletal: Negative for back pain. Skin: Negative for rash. Neurological: Negative for headaches, focal weakness or numbness.   ____________________________________________   PHYSICAL EXAM:  VITAL SIGNS: ED Triage Vitals [10/12/17 0140]  Enc Vitals Group     BP 119/64     Pulse Rate 62     Resp 18     Temp 98 F (36.7 C)     Temp Source Oral     SpO2 100 %     Weight 164 lb (74.4 kg)     Height 5\' 2"  (1.575 m)     Head Circumference  Peak Flow      Pain Score 5     Pain Loc      Pain Edu?      Excl. in GC?     Constitutional: Alert and oriented x4 pleasant cooperative appears somewhat uncomfortable holding her upper abdomen Eyes: PERRL EOMI. Head: Atraumatic. Nose: No congestion/rhinnorhea. Mouth/Throat: No trismus Neck: No stridor.   Cardiovascular: Normal rate, regular rhythm. Grossly normal heart sounds.  Good peripheral circulation. Respiratory: Normal respiratory effort.  No retractions. Lungs CTAB and moving good  air Gastrointestinal: Soft abdomen she is somewhat tender in the epigastrium as well as right and left upper quadrants.  No McBurney's tenderness negative Rovsing's no costovertebral tenderness Musculoskeletal: No lower extremity edema   Neurologic:  Normal speech and language. No gross focal neurologic deficits are appreciated. Skin:  Skin is warm, dry and intact. No rash noted. Psychiatric: Mood and affect are normal. Speech and behavior are normal.    ____________________________________________   DIFFERENTIAL includes but not limited to  Kidney stone, biliary colic, cholecystitis, appendicitis, pyelonephritis, ovarian pathology ____________________________________________   LABS (all labs ordered are listed, but only abnormal results are displayed)  Labs Reviewed  CBC WITH DIFFERENTIAL/PLATELET - Abnormal; Notable for the following components:      Result Value   Neutro Abs 7.8 (*)    All other components within normal limits  COMPREHENSIVE METABOLIC PANEL - Abnormal; Notable for the following components:   Glucose, Bld 109 (*)    Calcium 8.5 (*)    All other components within normal limits  URINALYSIS, COMPLETE (UACMP) WITH MICROSCOPIC - Abnormal; Notable for the following components:   Color, Urine YELLOW (*)    APPearance CLEAR (*)    Hgb urine dipstick SMALL (*)    All other components within normal limits  LIPASE, BLOOD  HCG, QUANTITATIVE, PREGNANCY  POCT PREGNANCY, URINE    Lab work reviewed by me shows the patient is not pregnant.  No acute disease noted __________________________________________  EKG   ____________________________________________  RADIOLOGY  CT scan abdomen pelvis reviewed by me with large gallstone concerning for acute cholecystitis Right upper quadrant ultrasound reviewed by me shows a thickened gallbladder wall  ____________________________________________   PROCEDURES  Procedure(s) performed: no  Procedures  Critical Care  performed: no  ____________________________________________   INITIAL IMPRESSION / ASSESSMENT AND PLAN / ED COURSE  Pertinent labs & imaging results that were available during my care of the patient were reviewed by me and considered in my medical decision making (see chart for details).   As part of my medical decision making, I reviewed the following data within the electronic MEDICAL RECORD NUMBER History obtained from family if available, nursing notes, old chart and ekg, as well as notes from prior ED visits.  The patient comes the emergency department with epigastric and left greater than right sided pain radiating to her back for the past year although clearly acutely worse today.  Differential is broad but given her abdominal pain that is acutely worse I do think she requires a CT scan with IV contrast.  Interestingly the CT scan is concerning only for a large gallstone and possible acute cholecystitis.  Ultrasound of the right upper quadrant is pending.  ----------------------------------------- 5:37 AM on 10/12/2017 -----------------------------------------  Ultrasound reviewed by me shows large gallstone along with thickened wall and slightly dilated CBD.  She has a negative Murphy's, no fever, and a normal white count although this is still concerning for cholecystitis.  I have a call out  to general surgery now.     ----------------------------------------- 6:31 AM on 10/12/2017 -----------------------------------------  Spoke with on-call general surgeon Dr. Aleen Campi who personally reviewed the patient's lab work and imaging.  The patient's history is not consistent with acute cholecystitis and is more consistent with chronic cholecystitis.  She has a negative Murphy's is not peritoneal taken her pain persist primarily on the left side.  Dr. Aleen Campi has an appointment for her at 3 PM today in less than 9 hours and as the patient's pain is adequately controlled with oral medications  I think this is a reasonable plan.  Strict return precautions have been given the patient is discharged home in improved condition. ____________________________________________   FINAL CLINICAL IMPRESSION(S) / ED DIAGNOSES  Final diagnoses:  Pain  Chronic cholecystitis      NEW MEDICATIONS STARTED DURING THIS VISIT:  New Prescriptions   HYDROCODONE-ACETAMINOPHEN (NORCO) 5-325 MG TABLET    Take 1 tablet by mouth every 6 (six) hours as needed for up to 7 doses for severe pain.   IBUPROFEN (ADVIL,MOTRIN) 600 MG TABLET    Take 1 tablet (600 mg total) by mouth every 8 (eight) hours as needed.   ONDANSETRON (ZOFRAN ODT) 4 MG DISINTEGRATING TABLET    Take 1 tablet (4 mg total) by mouth every 8 (eight) hours as needed for nausea or vomiting.     Note:  This document was prepared using Dragon voice recognition software and may include unintentional dictation errors.     Merrily Brittle, MD 10/12/17 [id number]

## 2017-10-12 NOTE — Progress Notes (Signed)
10/12/2017  Reason for Visit: Upper abdominal pain and cholelithiasis  History of Present Illness: Maria Hansen is a 27 y.o. female presented today for further evaluation of upper abdominal pain.  The patient presented to the emergency room overnight with sudden onset of upper abdominal pain radiating from the back towards the left side and into the front upper abdomen.  The patient also has associated symptom of nausea and did have an episode of vomiting that was forced herself in order to make the symptoms better.  She has had episodes in the past about once a week of pain of similar location and radiation but that only lasted about half hour.  Last night the episode lasted a few hours and that prompted her to come to the hospital.  She denies having any fevers or chills at that point, chest pain or shortness of breath.  She does have a history of heartburn and reports that sometimes after drinking a lot of water she can get heartburn, or after eating pizza, and today after having pasta.  She also has had some episodes of some right upper quadrant pain but she reports that this are much less intense and minuscule in nature compared to the pain that she gets on her left side and back.  She does not take any medications for reflux.  She also reports intermittent blood mixed in her stool.  Past Medical History: Past Medical History:  Diagnosis Date  . Acid reflux disease      Past Surgical History: --None  Home Medications: Prior to Admission medications   Medication Sig Start Date End Date Taking? Authorizing Provider  acetaminophen (TYLENOL) 650 MG CR tablet Take by mouth. 01/01/17  Yes [provider]  HYDROcodone-acetaminophen (NORCO) 5-325 MG tablet Take 1 tablet by mouth every 6 (six) hours as needed for up to 7 doses for severe pain. 10/12/17  Yes Merrily Brittle, MD  ibuprofen (ADVIL,MOTRIN) 600 MG tablet Take 1 tablet (600 mg total) by mouth every 8 (eight) hours as needed.  10/12/17  Yes Merrily Brittle, MD  nitrofurantoin, macrocrystal-monohydrate, (MACROBID) 100 MG capsule Take 1 capsule (100 mg total) by mouth 2 (two) times daily. 11/27/14  Yes Menshew, Charlesetta Ivory, PA-C  Norgestimate-Ethinyl Estradiol Triphasic 0.18/0.215/0.25 MG-35 MCG tablet Take by mouth. 08/19/17 08/19/18 Yes [provider]  ondansetron (ZOFRAN ODT) 4 MG disintegrating tablet Take 1 tablet (4 mg total) by mouth every 8 (eight) hours as needed for nausea or vomiting. 10/12/17  Yes Merrily Brittle, MD  phenazopyridine (PYRIDIUM) 200 MG tablet Take 1 tablet (200 mg total) by mouth 3 (three) times daily as needed for pain. 11/27/14  Yes Menshew, Charlesetta Ivory, PA-C  Prenatal Vit-Fe Fumarate-FA (PNV PRENATAL PLUS MULTIVITAMIN) 27-1 MG TABS Take by mouth.   Yes [provider]    Allergies: No Known Allergies  Social History:  reports that she has never smoked. She has never used smokeless tobacco. She reports that she does not drink alcohol or use drugs.   Family History: Hypertension in her mother, diabetes in her paternal grandmother, alcohol abuse in her father  Review of Systems: Review of Systems  Constitutional: Negative for chills and fever.  HENT: Negative for hearing loss.   Respiratory: Negative for shortness of breath.   Cardiovascular: Negative for chest pain.  Gastrointestinal: Positive for abdominal pain, blood in stool, heartburn, nausea and vomiting. Negative for constipation and diarrhea.  Genitourinary: Negative for dysuria.  Musculoskeletal: Negative for myalgias.  Skin: Negative for rash.  Neurological: Negative for dizziness.  Psychiatric/Behavioral: Negative for depression.    Physical Exam BP 118/84   Pulse 84   Temp 97.7 F (36.5 C) (Skin)   Resp 12   Ht 5\' 2"  (1.575 m)   Wt 169 lb (76.7 kg)   LMP 09/17/2017 (Exact Date) Comment: neg preg test  BMI 30.91 kg/m  CONSTITUTIONAL: No acute distress HEENT:  Normocephalic, atraumatic,  extraocular motion intact. NECK: Trachea is midline, and there is no jugular venous distension.  RESPIRATORY:  Lungs are clear, and breath sounds are equal bilaterally. Normal respiratory effort without pathologic use of accessory muscles. CARDIOVASCULAR: Heart is regular without murmurs, gallops, or rubs. GI: The abdomen is soft, nondistended, currently nontender to palpation in any of the quadrants. There were no palpable masses.  MUSCULOSKELETAL:  Normal muscle strength and tone in all four extremities.  No peripheral edema or cyanosis. SKIN: Skin turgor is normal. There are no pathologic skin lesions.  NEUROLOGIC:  Motor and sensation is grossly normal.  Cranial nerves are grossly intact. PSYCH:  Alert and oriented to person, place and time. Affect is normal.  Laboratory Analysis: Results for orders placed or performed during the hospital encounter of 10/12/17 (from the past 24 hour(s))  CBC with Differential     Status: Abnormal   Collection Time: 10/12/17  1:46 AM  Result Value Ref Range   WBC 10.7 3.6 - 11.0 K/uL   RBC 4.67 3.80 - 5.20 MIL/uL   Hemoglobin 13.8 12.0 - 16.0 g/dL   HCT 95.6 21.3 - 08.6 %   MCV 84.6 80.0 - 100.0 fL   MCH 29.5 26.0 - 34.0 pg   MCHC 34.8 32.0 - 36.0 g/dL   RDW 57.8 46.9 - 62.9 %   Platelets 317 150 - 440 K/uL   Neutrophils Relative % 73 %   Neutro Abs 7.8 (H) 1.4 - 6.5 K/uL   Lymphocytes Relative 22 %   Lymphs Abs 2.3 1.0 - 3.6 K/uL   Monocytes Relative 5 %   Monocytes Absolute 0.5 0.2 - 0.9 K/uL   Eosinophils Relative 1 %   Eosinophils Absolute 0.1 0 - 0.7 K/uL   Basophils Relative 1 %   Basophils Absolute 0.1 0 - 0.1 K/uL  Comprehensive metabolic panel     Status: Abnormal   Collection Time: 10/12/17  1:46 AM  Result Value Ref Range   Sodium 137 135 - 145 mmol/L   Potassium 3.9 3.5 - 5.1 mmol/L   Chloride 105 98 - 111 mmol/L   CO2 27 22 - 32 mmol/L   Glucose, Bld 109 (H) 70 - 99 mg/dL   BUN 12 6 - 20 mg/dL   Creatinine, Ser 5.28 0.44 -  1.00 mg/dL   Calcium 8.5 (L) 8.9 - 10.3 mg/dL   Total Protein 7.3 6.5 - 8.1 g/dL   Albumin 3.9 3.5 - 5.0 g/dL   AST 17 15 - 41 U/L   ALT 22 0 - 44 U/L   Alkaline Phosphatase 70 38 - 126 U/L   Total Bilirubin 0.3 0.3 - 1.2 mg/dL   GFR calc non Af Amer >60 >60 mL/min   GFR calc Af Amer >60 >60 mL/min   Anion gap 5 5 - 15  Lipase, blood     Status: None   Collection Time: 10/12/17  1:46 AM  Result Value Ref Range   Lipase 42 11 - 51 U/L  hCG, quantitative, pregnancy     Status: None   Collection Time: 10/12/17  1:46  AM  Result Value Ref Range   hCG, Beta Chain, Quant, S <1 <5 mIU/mL  Urinalysis, Complete w Microscopic     Status: Abnormal   Collection Time: 10/12/17  2:34 AM  Result Value Ref Range   Color, Urine YELLOW (A) YELLOW   APPearance CLEAR (A) CLEAR   Specific Gravity, Urine 1.014 1.005 - 1.030   pH 5.0 5.0 - 8.0   Glucose, UA NEGATIVE NEGATIVE mg/dL   Hgb urine dipstick SMALL (A) NEGATIVE   Bilirubin Urine NEGATIVE NEGATIVE   Ketones, ur NEGATIVE NEGATIVE mg/dL   Protein, ur NEGATIVE NEGATIVE mg/dL   Nitrite NEGATIVE NEGATIVE   Leukocytes, UA NEGATIVE NEGATIVE   RBC / HPF 0-5 0 - 5 RBC/hpf   WBC, UA 0-5 0 - 5 WBC/hpf   Bacteria, UA NONE SEEN NONE SEEN   Squamous Epithelial / LPF 0-5 0 - 5   Mucus PRESENT   Pregnancy, urine POC     Status: None   Collection Time: 10/12/17  2:36 AM  Result Value Ref Range   Preg Test, Ur NEGATIVE NEGATIVE    Imaging: Ct Abdomen Pelvis W Contrast  Result Date: 10/12/2017 CLINICAL DATA:  Left flank pain for 1 year. Tonight with nausea and vomiting. EXAM: CT ABDOMEN AND PELVIS WITH CONTRAST TECHNIQUE: Multidetector CT imaging of the abdomen and pelvis was performed using the standard protocol following bolus administration of intravenous contrast. CONTRAST:  ISOVUE-300 IOPAMIDOL (ISOVUE-300) INJECTION 61% COMPARISON:  None. FINDINGS: Lower chest: Lung bases are clear. Hepatobiliary: Large gallstone. Pericholecystic edema.  Changes may represent acute cholecystitis. No bile duct dilatation. No focal liver lesions. Pancreas: Unremarkable. No pancreatic ductal dilatation or surrounding inflammatory changes. Spleen: Normal in size without focal abnormality. Adrenals/Urinary Tract: Adrenal glands are unremarkable. Kidneys are normal, without renal calculi, focal lesion, or hydronephrosis. Bladder is unremarkable. Stomach/Bowel: Stomach is within normal limits. Appendix appears normal. No evidence of bowel wall thickening, distention, or inflammatory changes. Vascular/Lymphatic: No significant vascular findings are present. No enlarged abdominal or pelvic lymph nodes. Reproductive: Uterus and bilateral adnexa are unremarkable. Other: No abdominal wall hernia or abnormality. No abdominopelvic ascites. Musculoskeletal: No acute or significant osseous findings. IMPRESSION: 1. Large gallstone with pericholecystic edema suggesting acute cholecystitis. 2. No evidence of bowel obstruction or inflammation. Normal appendix. Electronically Signed   By: Burman Nieves M.D.   On: 10/12/2017 05:03   US Abdomen Limited Ruq  Result Date: 10/12/2017 CLINICAL DATA:  Initial evaluation for acute left-sided abdominal pain. EXAM: ULTRASOUND ABDOMEN LIMITED RIGHT UPPER QUADRANT COMPARISON:  Prior CT from earlier the same day. FINDINGS: Gallbladder: 2.4 cm shadowing echogenic stone seen lodged at the gallbladder neck. Associated internal gallbladder sludge. Gallbladder wall thickened up to 5 mm. Scattered echo densities within the gallbladder wall with associated ring down artifact consistent with adenomyosis. Positive sonographic Murphy sign elicited on exam. Common bile duct: Diameter: 4.9 mm Liver: No focal lesion identified. Within normal limits in parenchymal echogenicity. Portal vein is patent on color Doppler imaging with normal direction of blood flow towards the liver. IMPRESSION: 1. 2.4 cm stone lodged at the gallbladder neck with associated  sludge, wall thickening, and positive sonographic Murphy sign. Clinical correlation for acute cholecystitis recommended. 2. No biliary dilatation. Electronically Signed   By: Rise Mu M.D.   On: 10/12/2017 05:52    Assessment and Plan: This is a 27 y.o. female with cholelithiasis and upper abdominal pain.  I have independently reviewed the patient's imaging studies and reviewed the patient's laboratory studies.  Overall her CT scan does show a large gallstone in her gallbladder neck with gallbladder wall thickening but otherwise no pericholecystic fluid.  That is confirmed on ultrasound.  Currently she is asymptomatic with no pain.  Laboratory work-up was unremarkable.  Discussed with the patient is some of her symptoms do not fully correlate with symptomatic cholelithiasis and that her pain is mostly worse on the left side compared to the right.  She does describe a lot of issues with heartburn and my concern could be that she has ulcers versus severe reflux that is contributing to her symptoms.  She does have some episodes of right upper quadrant pain but she describes these as much less intense compared to the left side.  I would not be surprised looking at her CAT scan and ultrasound that she does have some issues with her gallbladder but I think really the main concern is more likely gastric in nature rather than biliary.  I would rather refer her first to gastroenterology for further evaluation of her symptoms and if they concluded that her symptoms are biliary related only then I will be happy to talk with her further about cholecystectomy.  However otherwise I would rather her get worked up and treated for likely acid reflux and gastric ulcers.  I did discuss with her the need to start a low-fat diet to also help in case this is biliary related.  She can follow-up with us as necessary depending on the results of her work-up with gastroenterology.  With this plan and all of her questions  have been answered.  Face-to-face time spent with the patient and care providers was 45 minutes, with more than 50% of the time spent counseling, educating, and coordinating care of the patient.     Howie IllJose Luis Lizzie An, MD [facility]

## 2017-10-12 NOTE — ED Triage Notes (Signed)
Patient ambulatory to triage with steady gait, without difficulty or distress noted; pt reports left side/flank pain approx year, worse tonight with N/V

## 2017-10-12 NOTE — Discharge Instructions (Signed)
Please take your pain and nausea medication as needed for severe symptoms and follow-up in general surgery clinic at 3 PM today as we discussed.  The clinic does not open until 8 AM but once it does open please give them a call to confirm your appointment.  Dr. Aleen Campi is expecting to see you at 3.  Please return to the emergency department sooner for any new or worsening symptoms such as worsening pain, nausea, vomiting, fevers, chills, or for any other issues whatsoever.  It was a pleasure to take care of you today, and thank you for coming to our emergency department.  If you have any questions or concerns before leaving please ask the nurse to grab me and I'm more than happy to go through your aftercare instructions again.  If you were prescribed any opioid pain medication today such as Norco, Vicodin, Percocet, morphine, hydrocodone, or oxycodone please make sure you do not drive when you are taking this medication as it can alter your ability to drive safely.  If you have any concerns once you are home that you are not improving or are in fact getting worse before you can make it to your follow-up appointment, please do not hesitate to call [phone] and come back for further evaluation.  Merrily Brittle, MD  Results for orders placed or performed during the hospital encounter of 10/12/17  CBC with Differential  Result Value Ref Range   WBC 10.7 3.6 - 11.0 K/uL   RBC 4.67 3.80 - 5.20 MIL/uL   Hemoglobin 13.8 12.0 - 16.0 g/dL   HCT 16.1 09.6 - 04.5 %   MCV 84.6 80.0 - 100.0 fL   MCH 29.5 26.0 - 34.0 pg   MCHC 34.8 32.0 - 36.0 g/dL   RDW 40.9 81.1 - 91.4 %   Platelets 317 150 - 440 K/uL   Neutrophils Relative % 73 %   Neutro Abs 7.8 (H) 1.4 - 6.5 K/uL   Lymphocytes Relative 22 %   Lymphs Abs 2.3 1.0 - 3.6 K/uL   Monocytes Relative 5 %   Monocytes Absolute 0.5 0.2 - 0.9 K/uL   Eosinophils Relative 1 %   Eosinophils Absolute 0.1 0 - 0.7 K/uL   Basophils Relative 1 %   Basophils Absolute  0.1 0 - 0.1 K/uL  Comprehensive metabolic panel  Result Value Ref Range   Sodium 137 135 - 145 mmol/L   Potassium 3.9 3.5 - 5.1 mmol/L   Chloride 105 98 - 111 mmol/L   CO2 27 22 - 32 mmol/L   Glucose, Bld 109 (H) 70 - 99 mg/dL   BUN 12 6 - 20 mg/dL   Creatinine, Ser 7.82 0.44 - 1.00 mg/dL   Calcium 8.5 (L) 8.9 - 10.3 mg/dL   Total Protein 7.3 6.5 - 8.1 g/dL   Albumin 3.9 3.5 - 5.0 g/dL   AST 17 15 - 41 U/L   ALT 22 0 - 44 U/L   Alkaline Phosphatase 70 38 - 126 U/L   Total Bilirubin 0.3 0.3 - 1.2 mg/dL   GFR calc non Af Amer >60 >60 mL/min   GFR calc Af Amer >60 >60 mL/min   Anion gap 5 5 - 15  Urinalysis, Complete w Microscopic  Result Value Ref Range   Color, Urine YELLOW (A) YELLOW   APPearance CLEAR (A) CLEAR   Specific Gravity, Urine 1.014 1.005 - 1.030   pH 5.0 5.0 - 8.0   Glucose, UA NEGATIVE NEGATIVE mg/dL   Hgb urine  dipstick SMALL (A) NEGATIVE   Bilirubin Urine NEGATIVE NEGATIVE   Ketones, ur NEGATIVE NEGATIVE mg/dL   Protein, ur NEGATIVE NEGATIVE mg/dL   Nitrite NEGATIVE NEGATIVE   Leukocytes, UA NEGATIVE NEGATIVE   RBC / HPF 0-5 0 - 5 RBC/hpf   WBC, UA 0-5 0 - 5 WBC/hpf   Bacteria, UA NONE SEEN NONE SEEN   Squamous Epithelial / LPF 0-5 0 - 5   Mucus PRESENT   Lipase, blood  Result Value Ref Range   Lipase 42 11 - 51 U/L  hCG, quantitative, pregnancy  Result Value Ref Range   hCG, Beta Chain, Quant, S <1 <5 mIU/mL  Pregnancy, urine POC  Result Value Ref Range   Preg Test, Ur NEGATIVE NEGATIVE   Ct Abdomen Pelvis W Contrast  Result Date: 10/12/2017 CLINICAL DATA:  Left flank pain for 1 year. Tonight with nausea and vomiting. EXAM: CT ABDOMEN AND PELVIS WITH CONTRAST TECHNIQUE: Multidetector CT imaging of the abdomen and pelvis was performed using the standard protocol following bolus administration of intravenous contrast. CONTRAST:  100mL ISOVUE-300 IOPAMIDOL (ISOVUE-300) INJECTION 61% COMPARISON:  None. FINDINGS: Lower chest: Lung bases are clear.  Hepatobiliary: Large gallstone. Pericholecystic edema. Changes may represent acute cholecystitis. No bile duct dilatation. No focal liver lesions. Pancreas: Unremarkable. No pancreatic ductal dilatation or surrounding inflammatory changes. Spleen: Normal in size without focal abnormality. Adrenals/Urinary Tract: Adrenal glands are unremarkable. Kidneys are normal, without renal calculi, focal lesion, or hydronephrosis. Bladder is unremarkable. Stomach/Bowel: Stomach is within normal limits. Appendix appears normal. No evidence of bowel wall thickening, distention, or inflammatory changes. Vascular/Lymphatic: No significant vascular findings are present. No enlarged abdominal or pelvic lymph nodes. Reproductive: Uterus and bilateral adnexa are unremarkable. Other: No abdominal wall hernia or abnormality. No abdominopelvic ascites. Musculoskeletal: No acute or significant osseous findings. IMPRESSION: 1. Large gallstone with pericholecystic edema suggesting acute cholecystitis. 2. No evidence of bowel obstruction or inflammation. Normal appendix. Electronically Signed   By: Burman NievesWilliam  Stevens M.D.   On: 10/12/2017 05:03   [place] Abdomen Limited Ruq  Result Date: 10/12/2017 CLINICAL DATA:  Initial evaluation for acute left-sided abdominal pain. EXAM: ULTRASOUND ABDOMEN LIMITED RIGHT UPPER QUADRANT COMPARISON:  Prior CT from earlier the same day. FINDINGS: Gallbladder: 2.4 cm shadowing echogenic stone seen lodged at the gallbladder neck. Associated internal gallbladder sludge. Gallbladder wall thickened up to 5 mm. Scattered echo densities within the gallbladder wall with associated ring down artifact consistent with adenomyosis. Positive sonographic Murphy sign elicited on exam. Common bile duct: Diameter: 4.9 mm Liver: No focal lesion identified. Within normal limits in parenchymal echogenicity. Portal vein is patent on color Doppler imaging with normal direction of blood flow towards the liver. IMPRESSION: 1. 2.4 cm  stone lodged at the gallbladder neck with associated sludge, wall thickening, and positive sonographic Murphy sign. Clinical correlation for acute cholecystitis recommended. 2. No biliary dilatation. Electronically Signed   By: Rise MuBenjamin  McClintock M.D.   On: 10/12/2017 05:52

## 2017-10-12 NOTE — ED Notes (Signed)
Pt sitting up in bed, Dr Lamont Snowballifenbark at bedside, NAD at this time.

## 2017-10-12 NOTE — Patient Instructions (Addendum)
Patient referred to [facility] . Return as needed. The patient is aware to call back for any questions or concerns. Low-Fat Diet for Pancreatitis or Gallbladder Conditions A low-fat diet can be helpful if you have pancreatitis or a gallbladder condition. With these conditions, your pancreas and gallbladder have trouble digesting fats. A healthy eating plan with less fat will help rest your pancreas and gallbladder and reduce your symptoms. What do I need to know about this diet?  Eat a low-fat diet. ? Reduce your fat intake to less than 20-30% of your total daily calories. This is less than 50-60 g of fat per day. ? Remember that you need some fat in your diet. Ask your dietician what your daily goal should be. ? Choose nonfat and low-fat healthy foods. Look for the words "nonfat," "low fat," or "fat free." ? As a guide, look on the label and choose foods with less than 3 g of fat per serving. Eat only one serving.  Avoid alcohol.  Do not smoke. If you need help quitting, talk with your health care provider.  Eat small frequent meals instead of three large heavy meals. What foods can I eat? Grains Include healthy grains and starches such as potatoes, wheat bread, fiber-rich cereal, and brown rice. Choose whole grain options whenever possible. In adults, whole grains should account for 45-65% of your daily calories. Fruits and Vegetables Eat plenty of fruits and vegetables. Fresh fruits and vegetables add fiber to your diet. Meats and Other Protein Sources Eat lean meat such as chicken and pork. Trim any fat off of meat before cooking it. Eggs, fish, and beans are other sources of protein. In adults, these foods should account for 10-35% of your daily calories. Dairy Choose low-fat milk and dairy options. Dairy includes fat and protein, as well as calcium. Fats and Oils Limit high-fat foods such as fried foods, sweets, baked goods, sugary drinks. Other Creamy sauces and condiments, such  as mayonnaise, can add extra fat. Think about whether or not you need to use them, or use smaller amounts or low fat options. What foods are not recommended?  High fat foods, such as: ? [employer] CorporationBaked goods. ? Ice cream. ? JamaicaFrench toast. ? Sweet rolls. ? Pizza. ? Cheese bread. ? Foods covered with batter, butter, creamy sauces, or cheese. ? Fried foods. ? Sugary drinks and desserts.  Foods that cause gas or bloating This information is not intended to replace advice given to you by your health care provider. Make sure you discuss any questions you have with your health care provider. Document Released: 01/17/2013 Document Revised: 06/20/2015 Document Reviewed: 12/26/2012 Elsevier Interactive Patient Education  2017 [employer]

## 2017-10-12 NOTE — ED Notes (Signed)
Pt resting in bed NAD noted, bed locked and low, side rails up. Warm blanket given.

## 2017-10-22 DIAGNOSIS — Z5321 Procedure and treatment not carried out due to patient leaving prior to being seen by health care provider: Secondary | ICD-10-CM | POA: Insufficient documentation

## 2017-10-22 DIAGNOSIS — K8020 Calculus of gallbladder without cholecystitis without obstruction: Secondary | ICD-10-CM | POA: Insufficient documentation

## 2017-10-23 ENCOUNTER — Encounter: Payer: Self-pay | Admitting: Emergency Medicine

## 2017-10-23 ENCOUNTER — Emergency Department
Admission: EM | Admit: 2017-10-23 | Discharge: 2017-10-23 | Disposition: A | Discharge status: Home / Self Care | Payer: Commercial Managed Care - PPO | Attending: Emergency Medicine | Admitting: Emergency Medicine

## 2017-10-23 ENCOUNTER — Other Ambulatory Visit: Payer: Self-pay

## 2017-10-23 LAB — COMPREHENSIVE METABOLIC PANEL
ALT: 21 U/L (ref 0–44)
ANION GAP: 8 (ref 5–15)
AST: 19 U/L (ref 15–41)
Albumin: 3.8 g/dL (ref 3.5–5.0)
Alkaline Phosphatase: 65 U/L (ref 38–126)
BUN: 11 mg/dL (ref 6–20)
CO2: 24 mmol/L (ref 22–32)
Calcium: 8.5 mg/dL — ABNORMAL LOW (ref 8.9–10.3)
Chloride: 107 mmol/L (ref 98–111)
Creatinine, Ser: 0.64 mg/dL (ref 0.44–1.00)
Glucose, Bld: 107 mg/dL — ABNORMAL HIGH (ref 70–99)
POTASSIUM: 3.7 mmol/L (ref 3.5–5.1)
Sodium: 139 mmol/L (ref 135–145)
TOTAL PROTEIN: 6.9 g/dL (ref 6.5–8.1)
Total Bilirubin: 0.6 mg/dL (ref 0.3–1.2)

## 2017-10-23 LAB — CBC
HEMATOCRIT: 37.6 % (ref 35.0–47.0)
HEMOGLOBIN: 13.5 g/dL (ref 12.0–16.0)
MCH: 30 pg (ref 26.0–34.0)
MCHC: 35.8 g/dL (ref 32.0–36.0)
MCV: 83.7 fL (ref 80.0–100.0)
Platelets: 273 10*3/uL (ref 150–440)
RBC: 4.49 MIL/uL (ref 3.80–5.20)
RDW: 12.5 % (ref 11.5–14.5)
WBC: 9 10*3/uL (ref 3.6–11.0)

## 2017-10-23 LAB — LIPASE, BLOOD: LIPASE: 47 U/L (ref 11–51)

## 2017-10-23 NOTE — ED Notes (Signed)
Pt unable to give urine sample at this time; pt has specimen cup. 

## 2017-10-23 NOTE — ED Notes (Signed)
RN called several times pt at 02:04 am and 02:20am no answer

## 2017-10-23 NOTE — ED Triage Notes (Signed)
Pt arrives ambulatory with steady gait to triage for reflux and abdominal pain. Pt reports that she was diagnosed with gallstones x 1 week ago and this feels like the same spot. Pt also states that prescription pain medication is not helping at this time. Pt is in NAD.

## 2017-12-09 ENCOUNTER — Emergency Department
Admission: EM | Admit: 2017-12-09 | Discharge: 2017-12-09 | Disposition: A | Discharge status: Home / Self Care | Payer: Commercial Managed Care - PPO | Attending: Emergency Medicine | Admitting: Emergency Medicine

## 2017-12-09 ENCOUNTER — Other Ambulatory Visit: Payer: Self-pay

## 2017-12-09 ENCOUNTER — Emergency Department: Payer: Commercial Managed Care - PPO

## 2017-12-09 ENCOUNTER — Encounter: Payer: Self-pay | Admitting: Emergency Medicine

## 2017-12-09 DIAGNOSIS — Z79899 Other long term (current) drug therapy: Secondary | ICD-10-CM | POA: Insufficient documentation

## 2017-12-09 DIAGNOSIS — K8020 Calculus of gallbladder without cholecystitis without obstruction: Secondary | ICD-10-CM | POA: Diagnosis not present

## 2017-12-09 DIAGNOSIS — R1010 Upper abdominal pain, unspecified: Secondary | ICD-10-CM | POA: Diagnosis present

## 2017-12-09 DIAGNOSIS — M65331 Trigger finger, right middle finger: Secondary | ICD-10-CM | POA: Insufficient documentation

## 2017-12-09 DIAGNOSIS — R1011 Right upper quadrant pain: Secondary | ICD-10-CM

## 2017-12-09 LAB — CBC WITH DIFFERENTIAL/PLATELET
ABS IMMATURE GRANULOCYTES: 0.04 10*3/uL (ref 0.00–0.07)
BASOS PCT: 0 %
Basophils Absolute: 0 10*3/uL (ref 0.0–0.1)
EOS ABS: 0.1 10*3/uL (ref 0.0–0.5)
Eosinophils Relative: 1 %
HCT: 40.5 % (ref 36.0–46.0)
Hemoglobin: 14 g/dL (ref 12.0–15.0)
IMMATURE GRANULOCYTES: 0 %
Lymphocytes Relative: 24 %
Lymphs Abs: 2.5 10*3/uL (ref 0.7–4.0)
MCH: 28.9 pg (ref 26.0–34.0)
MCHC: 34.6 g/dL (ref 30.0–36.0)
MCV: 83.7 fL (ref 80.0–100.0)
MONO ABS: 0.6 10*3/uL (ref 0.1–1.0)
MONOS PCT: 6 %
NEUTROS PCT: 69 %
Neutro Abs: 7.3 10*3/uL (ref 1.7–7.7)
PLATELETS: 295 10*3/uL (ref 150–400)
RBC: 4.84 MIL/uL (ref 3.87–5.11)
RDW: 11.8 % (ref 11.5–15.5)
WBC: 10.6 10*3/uL — ABNORMAL HIGH (ref 4.0–10.5)
nRBC: 0 % (ref 0.0–0.2)

## 2017-12-09 LAB — COMPREHENSIVE METABOLIC PANEL
ALT: 15 U/L (ref 0–44)
AST: 16 U/L (ref 15–41)
Albumin: 4 g/dL (ref 3.5–5.0)
Alkaline Phosphatase: 70 U/L (ref 38–126)
Anion gap: 4 — ABNORMAL LOW (ref 5–15)
BUN: 16 mg/dL (ref 6–20)
CHLORIDE: 105 mmol/L (ref 98–111)
CO2: 27 mmol/L (ref 22–32)
CREATININE: 0.58 mg/dL (ref 0.44–1.00)
Calcium: 8.7 mg/dL — ABNORMAL LOW (ref 8.9–10.3)
Glucose, Bld: 104 mg/dL — ABNORMAL HIGH (ref 70–99)
POTASSIUM: 3.7 mmol/L (ref 3.5–5.1)
SODIUM: 136 mmol/L (ref 135–145)
Total Bilirubin: 0.6 mg/dL (ref 0.3–1.2)
Total Protein: 7.1 g/dL (ref 6.5–8.1)

## 2017-12-09 LAB — LIPASE, BLOOD: LIPASE: 44 U/L (ref 11–51)

## 2017-12-09 MED ORDER — MORPHINE SULFATE (PF) 2 MG/ML IV SOLN
2.0000 mg | Freq: Once | INTRAVENOUS | Status: AC
  Administered 2017-12-09: 2 mg via INTRAVENOUS
  Filled 2017-12-09: qty 1

## 2017-12-09 MED ORDER — ONDANSETRON HCL 4 MG/2ML IJ SOLN
4.0000 mg | Freq: Once | INTRAMUSCULAR | Status: AC
  Administered 2017-12-09: 4 mg via INTRAVENOUS
  Filled 2017-12-09: qty 2

## 2017-12-09 NOTE — ED Notes (Signed)
Patient returned from Ultrasound. 

## 2017-12-09 NOTE — ED Notes (Signed)
Pt states this has happened before and always when she has heavy food. Tonight she had a heavy meal

## 2017-12-09 NOTE — ED Provider Notes (Signed)
[facility] Emergency Department Provider Note   First MD Initiated Contact with Patient 12/09/17 0025     (approximate)  I have reviewed the triage vital signs and the nursing notes.   HISTORY  Chief Complaint Abdominal Pain    HPI Ruthine DoseKarina Tencza is a 27 y.o. female with history of cholelithiasis presents to the emergency department with upper abdominal pain that is currently 7 out of 10 with associated nausea and vomiting.  Patient denies any fever.  Patient denies any urinary symptoms.  Patient denies any diarrhea or constipation.   Past Medical History:  Diagnosis Date  . Acid reflux disease     Patient Active Problem List   Diagnosis Date Noted  . Calculus of gallbladder without cholecystitis without obstruction 10/12/2017  . Gastroesophageal reflux disease 10/12/2017  . 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 No pertinent surgical history.  Prior to Admission medications   Medication Sig Start Date End Date Taking? Authorizing Provider  acetaminophen (TYLENOL) 650 MG CR tablet Take by mouth. 01/01/17   [provider]  HYDROcodone-acetaminophen (NORCO) 5-325 MG tablet Take 1 tablet by mouth every 6 (six) hours as needed for up to 7 doses for severe pain. 10/12/17   Merrily Brittleifenbark, Neil, MD  ibuprofen (ADVIL,MOTRIN) 600 MG tablet Take 1 tablet (600 mg total) by mouth every 8 (eight) hours as needed. 10/12/17   Merrily Brittleifenbark, Neil, MD  nitrofurantoin, macrocrystal-monohydrate, (MACROBID) 100 MG capsule Take 1 capsule (100 mg total) by mouth 2 (two) times daily. 11/27/14   Menshew, Charlesetta IvoryJenise V Bacon, PA-C  Norgestimate-Ethinyl Estradiol Triphasic 0.18/0.215/0.25 MG-35 MCG tablet Take by mouth. 08/19/17 08/19/18  [provider]  ondansetron (ZOFRAN ODT) 4 MG disintegrating tablet Take 1 tablet (4 mg total) by mouth every 8 (eight) hours as needed for nausea or vomiting. 10/12/17   Merrily Brittleifenbark, Neil, MD    phenazopyridine (PYRIDIUM) 200 MG tablet Take 1 tablet (200 mg total) by mouth 3 (three) times daily as needed for pain. 11/27/14   Menshew, Charlesetta IvoryJenise V Bacon, PA-C  Prenatal Vit-Fe Fumarate-FA (PNV PRENATAL PLUS MULTIVITAMIN) 27-1 MG TABS Take by mouth.    [provider]    Allergies Patient has no known allergies.  No family history on file.  Social History Social History   Tobacco Use  . Smoking status: Never Smoker  . Smokeless tobacco: Never Used  Substance Use Topics  . Alcohol use: No  . Drug use: No    Review of Systems Constitutional: No fever/chills Eyes: No visual changes. ENT: No sore throat. Cardiovascular: Denies chest pain. Respiratory: Denies shortness of breath. Gastrointestinal: Positive for abdominal pain nausea and vomiting Genitourinary: Negative for dysuria. Musculoskeletal: Negative for neck pain.  Negative for back pain. Integumentary: Negative for rash. Neurological: Negative for headaches, focal weakness or numbness.   ____________________________________________   PHYSICAL EXAM:  VITAL SIGNS: ED Triage Vitals [12/09/17 0023]  Enc Vitals Group     BP 114/74     Pulse Rate 75     Resp 20     Temp (!) 97.5 F (36.4 C)     Temp Source Oral     SpO2 98 %     Weight 74.4 kg (164 lb)     Height 1.575 m (5\' 2" )     Head Circumference      Peak Flow      Pain Score 7     Pain Loc      Pain Edu?  Excl. in GC?     Constitutional: Alert and oriented. Well appearing and in no acute distress. Eyes: Conjunctivae are normal.  Head: Atraumatic. Mouth/Throat: Mucous membranes are moist.  Oropharynx non-erythematous. Neck: No stridor.   Cardiovascular: Normal rate, regular rhythm. Good peripheral circulation. Grossly normal heart sounds. Respiratory: Normal respiratory effort.  No retractions. Lungs CTAB. Gastrointestinal: Right upper quadrant tenderness to palpation.. No distention.  Musculoskeletal: No lower extremity tenderness  nor edema. No gross deformities of extremities. Neurologic:  Normal speech and language. No gross focal neurologic deficits are appreciated.  Skin:  Skin is warm, dry and intact. No rash noted. Psychiatric: Mood and affect are normal. Speech and behavior are normal.  ____________________________________________   LABS (all labs ordered are listed, but only abnormal results are displayed)  Labs Reviewed  CBC WITH DIFFERENTIAL/PLATELET - Abnormal; Notable for the following components:      Result Value   WBC 10.6 (*)    All other components within normal limits  COMPREHENSIVE METABOLIC PANEL - Abnormal; Notable for the following components:   Glucose, Bld 104 (*)    Calcium 8.7 (*)    Anion gap 4 (*)    All other components within normal limits  LIPASE, BLOOD    RADIOLOGY I, Conroy N Tino Ronan, personally viewed and evaluated these images (plain radiographs) as part of my medical decision making, as well as reviewing the written report by the radiologist.  ED MD interpretation: Persistent diffuse gallbladder wall thickening with a 2.2 cm stone lodged in the neck of the gallbladder per the radiologist.  Official radiology report(s): US Abdomen Limited Ruq  Result Date: 12/09/2017 CLINICAL DATA:  Acute onset of right upper quadrant abdominal pain, radiating to the back. Nausea and vomiting. EXAM: ULTRASOUND ABDOMEN LIMITED RIGHT UPPER QUADRANT COMPARISON:  CT of the abdomen and pelvis, and right upper quadrant ultrasound, performed 10/12/2017 FINDINGS: Gallbladder: There is diffuse gallbladder wall thickening, measuring up to 4 mm. A 2.2 cm stone is again seen lodged at the neck of the gallbladder. This is stable from since September. No ultrasonographic Murphy's sign is elicited, though this is equivocal as the patient is on pain medication. Sludge is also noted within the gallbladder. No pericholecystic fluid is seen. Mild ring down artifact from the gallbladder wall may reflect  underlying adenomyomatosis. Common bile duct: Diameter: 0.5 cm, within normal limits in caliber. Liver: No focal lesion identified. Within normal limits in parenchymal echogenicity. Portal vein is patent on color Doppler imaging with normal direction of blood flow towards the liver. IMPRESSION: 1. Persistent diffuse gallbladder wall thickening and 2.2 cm stone lodged at the neck of the gallbladder. As before, this raises concern for cholecystitis; would correlate with the patient's symptoms. 2. Sludge noted within the gallbladder. 3. Mild ring down artifact from the gallbladder wall may reflect underlying adenomyomatosis. Electronically Signed   By: Roanna Raider M.D.   On: 12/09/2017 02:39     Procedures   ____________________________________________   INITIAL IMPRESSION / ASSESSMENT AND PLAN / ED COURSE  As part of my medical decision making, I reviewed the following data within the electronic MEDICAL RECORD NUMBER   27 year old female presenting with above-stated history and physical exam secondary to abdominal pain nausea and vomiting.  Suspect possible cholelithiasis versus less likely cholecystitis as the etiology for the patient's symptoms and as such ultrasound was performed.  Ultrasound consistent with previous ultrasound was performed which revealed gallbladder wall thickening with a 2.2 cm stone noted.  Patient given IV morphine with  resolution of pain.  Patient also given IV Zofran with no further nausea or vomiting while in the emergency department.  Spoke with the patient at length regarding necessity of follow-up with general surgery Dr. Aleen Campi who the patient is seen in the past regarding gallstones.  Spoke with the patient at length regarding  warning signs that would warrant immediate return to the emergency department ____________________________________________  FINAL CLINICAL IMPRESSION(S) / ED DIAGNOSES  Final diagnoses:  RUQ pain  Calculus of gallbladder without cholecystitis  without obstruction     MEDICATIONS GIVEN DURING THIS VISIT:  Medications  morphine 2 MG/ML injection 2 mg (2 mg Intravenous Given 12/09/17 0052)  ondansetron (ZOFRAN) injection 4 mg (4 mg Intravenous Given 12/09/17 0052)     ED Discharge Orders    None       Note:  This document was prepared using Dragon voice recognition software and may include unintentional dictation errors.    Darci Current, MD 12/09/17 [phone]

## 2017-12-09 NOTE — ED Triage Notes (Signed)
.  Patient ambulatory to triage with steady gait, without difficulty, appears uncomfortable; reports dx with gallstones; since 9pm having right upper chest/abd pain radiating into back accomp by N/V and chills

## 2017-12-09 NOTE — ED Notes (Signed)
Patient transported to Ultrasound 

## 2018-01-04 ENCOUNTER — Ambulatory Visit: Payer: Commercial Managed Care - PPO | Admitting: Surgery

## 2018-01-07 ENCOUNTER — Encounter: Payer: Self-pay | Admitting: Surgery

## 2018-01-07 ENCOUNTER — Ambulatory Visit (INDEPENDENT_AMBULATORY_CARE_PROVIDER_SITE_OTHER): Payer: Commercial Managed Care - PPO | Admitting: Surgery

## 2018-01-07 ENCOUNTER — Other Ambulatory Visit: Payer: Self-pay

## 2018-01-07 VITALS — BP 120/70 | HR 68 | Temp 97.2°F | Resp 16 | Ht 62.0 in | Wt 166.0 lb

## 2018-01-07 DIAGNOSIS — K8020 Calculus of gallbladder without cholecystitis without obstruction: Secondary | ICD-10-CM | POA: Diagnosis not present

## 2018-01-07 NOTE — Progress Notes (Signed)
  01/07/2018  History of Present Illness: Maria DoseKarina Hansen is a 27 y.o. female seen in the past for symptomatic cholelithiasis.  She was seen last on 9/17.  Some of her symptoms were also concerning for heartburn and acid reflux.  However, since her last visit, she reports going to the hospital twice for abdominal pain.  Her last ED visit was 11/14 during which she had a repeat ultrasound which showed cholelithiasis with a stone in the neck of the gallbladder, and mild gallbladder wall thickening.  She presents today for further evaluation and because she would like to discuss surgery for her gallbladder.  Past Medical History: Past Medical History:  Diagnosis Date  . Acid reflux disease      Past Surgical History: Past Surgical History:  Procedure Laterality Date  . DILATION AND CURETTAGE OF UTERUS  2013, 2011,  . KNEE SURGERY Left     Home Medications: Prior to Admission medications   Medication Sig Start Date End Date Taking? Authorizing Provider  acetaminophen (TYLENOL) 650 MG CR tablet Take by mouth. 01/01/17  Yes [provider]    Allergies: No Known Allergies  Review of Systems: Review of Systems  Constitutional: Negative for chills and fever.  Respiratory: Negative for shortness of breath.   Cardiovascular: Negative for chest pain.  Gastrointestinal: Negative for abdominal pain, nausea and vomiting.    Physical Exam BP 120/70   Pulse 68   Temp (!) 97.2 F (36.2 C) (Temporal)   Resp 16   Ht 5\' 2"  (1.575 m)   Wt 166 lb (75.3 kg)   LMP 11/26/2017   SpO2 98%   BMI 30.36 kg/m  CONSTITUTIONAL: No acute distress HEENT:  Normocephalic, atraumatic, extraocular motion intact. RESPIRATORY:  Lungs are clear, and breath sounds are equal bilaterally. Normal respiratory effort without pathologic use of accessory muscles. CARDIOVASCULAR: Heart is regular without murmurs, gallops, or rubs. GI: The abdomen is soft, nondistended, currently nontender to palpation.   NEUROLOGIC:  Motor and sensation is grossly normal.  Cranial nerves are grossly intact. PSYCH:  Alert and oriented to person, place and time. Affect is normal.  Labs/Imaging: Ultrasound 11/14: IMPRESSION: 1. Persistent diffuse gallbladder wall thickening and 2.2 cm stone lodged at the neck of the gallbladder. As before, this raises concern for cholecystitis; would correlate with the patient's symptoms. 2. Sludge noted within the gallbladder. 3. Mild ring down artifact from the gallbladder wall may reflect underlying adenomyomatosis.  Assessment and Plan: This is a 27 y.o. female with symptomatic cholelithiasis.  Discussed with the patient that given her recurrent symptoms and findings on ultrasound, it is reasonable to proceed with surgery in the form of laparoscopic cholecystectomy.  Discussed the surgery at length, including the risks of bleeding, infection, and injury to surrounding structures.  Also discussed that this would be an outpatient procedure, and discussed post-op restrictions.  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  We'll plan on doing surgery on 01/20/18.  Face-to-face time spent with the patient and care providers was 25 minutes, with more than 50% of the time spent counseling, educating, and coordinating care of the patient.     Howie IllJose Luis Jamy Cleckler, MD [facility]

## 2018-01-07 NOTE — Patient Instructions (Addendum)
You have requested to have your gallbladder removed. This will be done 01/20/18  at [facility] with Dr. Henrene DodgeJose Piscoya. You will pre admit by phone. Surgery information has been given to the patient.   You will most likely be out of work 4-6  weeks for this surgery. You will return after your post-op appointment with a lifting restriction for approximately 4 more weeks.  You will be able to eat anything you would like to following surgery. But, start by eating a bland diet and advance this as tolerated. The Gallbladder diet is below, please go as closely by this diet as possible prior to surgery to avoid any further attacks.    Laparoscopic Cholecystectomy Laparoscopic cholecystectomy is surgery to remove the gallbladder. The gallbladder is located in the upper right part of the abdomen, behind the liver. It is a storage sac for bile, which is produced in the liver. Bile aids in the digestion and absorption of fats. Cholecystectomy is often done for inflammation of the gallbladder (cholecystitis). This condition is usually caused by a buildup of gallstones (cholelithiasis) in the gallbladder. Gallstones can block the flow of bile, and that can result in inflammation and pain. In severe cases, emergency surgery may be required. If emergency surgery is not required, you will have time to prepare for the procedure. Laparoscopic surgery is an alternative to open surgery. Laparoscopic surgery has a shorter recovery time. Your common bile duct may also need to be examined during the procedure. If stones are found in the common bile duct, they may be removed. LET [facility] PROVID[facility] KNOW ABOUT:  Any allergies you have.  All medicines you are taking, including vitamins, herbs, eye drops, creams, and over-the-counter medicines.  Previous problems you or members of your family have had with the use of anesthetics.  Any blood disorders you have.  Previous surgeries you have had.    Any  medical conditions you have. RISKS AND COMPLICATIONS Generally, this is a safe procedure. However, problems may occur, including:  Infection.  Bleeding.  Allergic reactions to medicines.  Damage to other structures or organs.  A stone remaining in the common bile duct.  A bile leak from the cyst duct that is clipped when your gallbladder is removed.  The need to convert to open surgery, which requires a larger incision in the abdomen. This may be necessary if your surgeon thinks that it is not safe to continue with a laparoscopic procedure. BEFORE THE PROCEDURE  Ask your health care provider about:  Changing or stopping your regular medicines. This is especially important if you are taking diabetes medicines or blood thinners.  Taking medicines such as aspirin and ibuprofen. These medicines can thin your blood. Do not take these medicines before your procedure if your health care provider instructs you not to.  Follow instructions from your health care provider about eating or drinking restrictions.  Let your health care provider know if you develop a cold or an infection before surgery.  Plan to have someone take you home after the procedure.  Ask your health care provider how your surgical site will be marked or identified.  You may be given antibiotic medicine to help prevent infection. PROCEDURE  To reduce your risk of infection:  Your health care team will wash or sanitize their hands.  Your skin will be washed with soap.  An IV tube may be inserted into one of your veins.  You will be given a medicine to make you fall  asleep (general anesthetic).  A breathing tube will be placed in your mouth.  The surgeon will make several small cuts (incisions) in your abdomen.  A thin, lighted tube (laparoscope) that has a tiny camera on the end will be inserted through one of the small incisions. The camera on the laparoscope will send a picture to a TV screen (monitor) in  the operating room. This will give the surgeon a good view inside your abdomen.  A gas will be pumped into your abdomen. This will expand your abdomen to give the surgeon more room to perform the surgery.  Other tools that are needed for the procedure will be inserted through the other incisions. The gallbladder will be removed through one of the incisions.  After your gallbladder has been removed, the incisions will be closed with stitches (sutures), staples, or skin glue.  Your incisions may be covered with a bandage (dressing). The procedure may vary among health care providers and hospitals. AFTER THE PROCEDURE  Your blood pressure, heart rate, breathing rate, and blood oxygen level will be monitored often until the medicines you were given have worn off.  You will be given medicines as needed to control your pain.   This information is not intended to replace advice given to you by your health care provider. Make sure you discuss any questions you have with your health care provider.   Document Released: 01/12/2005 Document Revised: 10/03/2014 Document Reviewed: 08/24/2012 Elsevier Interactive Patient Education 2016 [employer]   Low-Fat Diet for Gallbladder Conditions A low-fat diet can be helpful if you have pancreatitis or a gallbladder condition. With these conditions, your pancreas and gallbladder have trouble digesting fats. A healthy eating plan with less fat will help rest your pancreas and gallbladder and reduce your symptoms. WHAT DO I NEED TO KNOW ABOUT THIS DIET?  Eat a low-fat diet.  Reduce your fat intake to less than 20-30% of your total daily calories. This is less than 50-60 g of fat per day.  Remember that you need some fat in your diet. Ask your dietician what your daily goal should be.  Choose nonfat and low-fat healthy foods. Look for the words "nonfat," "low fat," or "fat free."  As a guide, look on the label and choose foods with less than 3 g of fat  per serving. Eat only one serving.  Avoid alcohol.  Do not smoke. If you need help quitting, talk with your health care provider.  Eat small frequent meals instead of three large heavy meals. WHAT FOODS CAN I EAT? Grains Include healthy grains and starches such as potatoes, wheat bread, fiber-rich cereal, and brown rice. Choose whole grain options whenever possible. In adults, whole grains should account for 45-65% of your daily calories.  Fruits and Vegetables Eat plenty of fruits and vegetables. Fresh fruits and vegetables add fiber to your diet. Meats and Other Protein Sources Eat lean meat such as chicken and pork. Trim any fat off of meat before cooking it. Eggs, fish, and beans are other sources of protein. In adults, these foods should account for 10-35% of your daily calories. Dairy Choose low-fat milk and dairy options. Dairy includes fat and protein, as well as calcium.  Fats and Oils Limit high-fat foods such as fried foods, sweets, baked goods, sugary drinks.  Other Creamy sauces and condiments, such as mayonnaise, can add extra fat. Think about whether or not you need to use them, or use smaller amounts or low fat options. WHAT FOODS  is not intended to replace advice given to you by your health care provider. Make sure you discuss any questions you have with your health care provider.   Document Released: 01/17/2013 Document Reviewed: 01/17/2013 Elsevier Interactive Patient Education 2016 Elsevier Inc.   

## 2018-01-12 ENCOUNTER — Encounter: Payer: Self-pay | Admitting: Emergency Medicine

## 2018-01-12 ENCOUNTER — Other Ambulatory Visit: Payer: Self-pay

## 2018-01-12 ENCOUNTER — Emergency Department: Payer: Commercial Managed Care - PPO

## 2018-01-12 ENCOUNTER — Emergency Department
Admission: EM | Admit: 2018-01-12 | Discharge: 2018-01-12 | Disposition: A | Discharge status: Home / Self Care | Payer: Commercial Managed Care - PPO | Attending: Emergency Medicine | Admitting: Emergency Medicine

## 2018-01-12 DIAGNOSIS — Z3201 Encounter for pregnancy test, result positive: Secondary | ICD-10-CM | POA: Insufficient documentation

## 2018-01-12 DIAGNOSIS — M545 Low back pain, unspecified: Secondary | ICD-10-CM

## 2018-01-12 LAB — URINALYSIS, COMPLETE (UACMP) WITH MICROSCOPIC
Bacteria, UA: NONE SEEN
Bilirubin Urine: NEGATIVE
Glucose, UA: NEGATIVE mg/dL
Hgb urine dipstick: NEGATIVE
KETONES UR: 20 mg/dL — AB
Leukocytes, UA: NEGATIVE
Nitrite: NEGATIVE
PH: 5 (ref 5.0–8.0)
Protein, ur: NEGATIVE mg/dL
Specific Gravity, Urine: 1.021 (ref 1.005–1.030)

## 2018-01-12 LAB — POCT PREGNANCY, URINE: Preg Test, Ur: POSITIVE — AB

## 2018-01-12 NOTE — ED Notes (Signed)
Pt history of pinched nerve and herniated disc. Pt was at work pushing/pulling and aggravated the injury. Pt complain of numbness down legs bilaterally.
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
## 2018-01-14 ENCOUNTER — Inpatient Hospital Stay
Admission: RE | Admit: 2018-01-14 | Discharge: 2018-01-14 | Disposition: A | Discharge status: Home / Self Care | Payer: Commercial Managed Care - PPO | Source: Ambulatory Visit

## 2018-01-14 NOTE — Pre-Procedure Instructions (Signed)
Called office regarding seeing that after pt left surgeons office on 12-13, that she went to ED on 01-12-18 and had a + pregnancy test. Called and spoke with Caryl-lyn at Dr Windell MomentPiscoyas office and notified her of this and to relay this info to Dr Aleen CampiPiscoya

## 2018-01-17 ENCOUNTER — Telehealth: Payer: Self-pay

## 2018-01-17 ENCOUNTER — Inpatient Hospital Stay: Admission: RE | Admit: 2018-01-17 | Payer: Commercial Managed Care - PPO | Source: Ambulatory Visit

## 2018-01-17 NOTE — Telephone Encounter (Signed)
Spoke with patient about her surgery scheduled for 01/20/18. She will be unable to have surgery as she just found out that she is pregnant. She is unsure what she wants to do at this point. I let her now that was fine and we would just cancel her surgery for now. She will call us once she decides what she is going to do and we can look into when we may reschedule her surgery. She will need to be seen by Dr Aleen CampiPiscoya prior to her surgery.

## 2018-01-18 ENCOUNTER — Encounter: Payer: Self-pay | Admitting: Obstetrics and Gynecology

## 2018-01-20 ENCOUNTER — Ambulatory Visit: Admission: RE | Admit: 2018-01-20 | Payer: Commercial Managed Care - PPO | Source: Home / Self Care | Admitting: Surgery

## 2018-01-20 ENCOUNTER — Encounter: Admission: RE | Payer: Self-pay | Source: Home / Self Care

## 2018-01-20 SURGERY — LAPAROSCOPIC CHOLECYSTECTOMY
Anesthesia: Choice

## 2018-01-21 ENCOUNTER — Emergency Department
Admission: EM | Admit: 2018-01-21 | Discharge: 2018-01-21 | Disposition: A | Discharge status: Home / Self Care | Payer: Commercial Managed Care - PPO | Attending: Emergency Medicine | Admitting: Emergency Medicine

## 2018-01-21 ENCOUNTER — Encounter: Payer: Self-pay | Admitting: Emergency Medicine

## 2018-01-21 ENCOUNTER — Other Ambulatory Visit: Payer: Self-pay

## 2018-01-21 DIAGNOSIS — K8050 Calculus of bile duct without cholangitis or cholecystitis without obstruction: Secondary | ICD-10-CM | POA: Insufficient documentation

## 2018-01-21 DIAGNOSIS — R1110 Vomiting, unspecified: Secondary | ICD-10-CM | POA: Diagnosis not present

## 2018-01-21 DIAGNOSIS — Z3202 Encounter for pregnancy test, result negative: Secondary | ICD-10-CM | POA: Diagnosis not present

## 2018-01-21 DIAGNOSIS — R1011 Right upper quadrant pain: Secondary | ICD-10-CM | POA: Insufficient documentation

## 2018-01-21 LAB — URINALYSIS, COMPLETE (UACMP) WITH MICROSCOPIC
BILIRUBIN URINE: NEGATIVE
Bacteria, UA: NONE SEEN
Glucose, UA: NEGATIVE mg/dL
Hgb urine dipstick: NEGATIVE
Ketones, ur: NEGATIVE mg/dL
Leukocytes, UA: NEGATIVE
Nitrite: NEGATIVE
Protein, ur: NEGATIVE mg/dL
Specific Gravity, Urine: 1.023 (ref 1.005–1.030)
pH: 5 (ref 5.0–8.0)

## 2018-01-21 LAB — COMPREHENSIVE METABOLIC PANEL
ALT: 33 U/L (ref 0–44)
AST: 24 U/L (ref 15–41)
Albumin: 4.2 g/dL (ref 3.5–5.0)
Alkaline Phosphatase: 61 U/L (ref 38–126)
Anion gap: 6 (ref 5–15)
BUN: 9 mg/dL (ref 6–20)
CO2: 24 mmol/L (ref 22–32)
Calcium: 9.4 mg/dL (ref 8.9–10.3)
Chloride: 107 mmol/L (ref 98–111)
Creatinine, Ser: 0.57 mg/dL (ref 0.44–1.00)
GFR calc Af Amer: >60 mL/min (ref >60)
GFR calc non Af Amer: >60 mL/min (ref >60)
Glucose, Bld: 106 mg/dL — ABNORMAL HIGH (ref 70–99)
POTASSIUM: 3.7 mmol/L (ref 3.5–5.1)
SODIUM: 137 mmol/L (ref 135–145)
Total Bilirubin: 0.6 mg/dL (ref 0.3–1.2)
Total Protein: 7.7 g/dL (ref 6.5–8.1)

## 2018-01-21 LAB — HCG, QUANTITATIVE, PREGNANCY: hCG, Beta Chain, Quant, S: 121887 m[IU]/mL — ABNORMAL HIGH (ref <5)

## 2018-01-21 LAB — CBC
HCT: 43.7 % (ref 36.0–46.0)
Hemoglobin: 14.7 g/dL (ref 12.0–15.0)
MCH: 28.5 pg (ref 26.0–34.0)
MCHC: 33.6 g/dL (ref 30.0–36.0)
MCV: 84.9 fL (ref 80.0–100.0)
Platelets: 361 10*3/uL (ref 150–400)
RBC: 5.15 MIL/uL — ABNORMAL HIGH (ref 3.87–5.11)
RDW: 13.2 % (ref 11.5–15.5)
WBC: 10.6 10*3/uL — ABNORMAL HIGH (ref 4.0–10.5)
nRBC: 0 % (ref 0.0–0.2)

## 2018-01-21 LAB — LIPASE, BLOOD: Lipase: 47 U/L (ref 11–51)

## 2018-01-21 MED ORDER — ONDANSETRON 4 MG PO TBDP
4.0000 mg | ORAL_TABLET | Freq: Once | ORAL | Status: DC | PRN
  Filled 2018-01-21: qty 1

## 2018-01-21 MED ORDER — OXYCODONE-ACETAMINOPHEN 5-325 MG PO TABS
1.0000 | ORAL_TABLET | Freq: Once | ORAL | Status: AC
  Administered 2018-01-21: 1 via ORAL
  Filled 2018-01-21: qty 1

## 2018-01-21 MED ORDER — ONDANSETRON 4 MG PO TBDP
4.0000 mg | ORAL_TABLET | Freq: Once | ORAL | Status: AC
  Administered 2018-01-21: 4 mg via ORAL

## 2018-01-21 MED ORDER — OXYCODONE-ACETAMINOPHEN 5-325 MG PO TABS: 1.0000 | ORAL_TABLET | ORAL | 0 refills | Status: DC | PRN

## 2018-01-21 MED ORDER — ONDANSETRON 4 MG PO TBDP: 4.0000 mg | ORAL_TABLET | Freq: Three times a day (TID) | ORAL | 0 refills | Status: DC | PRN

## 2018-01-21 NOTE — ED Triage Notes (Signed)
Pt here with c/o right upper back radiating around the right upper abd. States she was scheduled to have her gall bladder taken out today, however, cancelled it because she is [redacted] weeks pregnant and took abortion pill yesterday am. Pain and vomiting around 10pm and worsening pain as the hours have passed, still nauseated with nothing else to vomit per patient. Appears in pain.

## 2018-01-21 NOTE — ED Provider Notes (Signed)
[facility] Emergency Department Provider Note   ____________________________________________   First MD Initiated Contact with Patient 01/21/18 0303     (approximate)  I have reviewed the triage vital signs and the nursing notes.   HISTORY  Chief Complaint Abdominal Pain    HPI Maria Hansen is a 27 y.o. female who presents to the ED from home with a chief complaint of abdominal pain.  Patient complains of right upper quadrant abdominal pain radiating right to her flank.  She was scheduled to have laparoscopic cholecystectomy by Dr. Aleen CampiPiscoya on 12/23.  She canceled her surgery because she found out she was [redacted] weeks pregnant.  [facility] (Mifepristone) yesterday morning.  Ate a bowl of cereal and started to have pain and vomiting around 10 PM.  All symptoms have resolved without intervention.  Patient denies recent fever, chills, chest pain, shortness of breath, diarrhea.  Denies recent travel or trauma.   Past Medical History:  Diagnosis Date  . Acid reflux disease     Patient Active Problem List   Diagnosis Date Noted  . Trigger middle finger of right hand 12/09/2017  . Calculus of gallbladder without cholecystitis without obstruction 10/12/2017  . Gastroesophageal reflux disease 10/12/2017  . 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 DILATION AND CURETTAGE OF UTERUS  2013, 2011,  . KNEE SURGERY Left     Prior to Admission medications   Medication Sig Start Date End Date Taking? Authorizing Provider  ondansetron (ZOFRAN ODT) 4 MG disintegrating tablet Take 1 tablet (4 mg total) by mouth every 8 (eight) hours as needed for nausea or vomiting. 01/21/18   Irean HongSung, Jade J, MD  oxyCODONE-acetaminophen (PERCOCET/ROXICET) 5-325 MG tablet Take 1 tablet by mouth every 4 (four) hours as needed for severe pain. 01/21/18   Irean HongSung, Jade J, MD     Allergies Patient has no known allergies.  Family History  Problem Relation Age of Onset  . Gallbladder disease Mother   . Hypertension Father     Social History Social History   Tobacco Use  . Smoking status: Never Smoker  . Smokeless tobacco: Never Used  Substance Use Topics  . Alcohol use: No  . Drug use: No    Review of Systems  Constitutional: No fever/chills Eyes: No visual changes. ENT: No sore throat. Cardiovascular: Denies chest pain. Respiratory: Denies shortness of breath. Gastrointestinal: Positive for abdominal pain, nausea and vomiting.  No diarrhea.  No constipation. Genitourinary: Negative for dysuria. Musculoskeletal: Negative for back pain. Skin: Negative for rash. Neurological: Negative for headaches, focal weakness or numbness.   ____________________________________________   PHYSICAL EXAM:  VITAL SIGNS: ED Triage Vitals   Enc Vitals Group     BP 117/77     Pulse Rate 88     Resp 18     Temp (!) 97.5 F (36.4 C)     Temp Source Oral     SpO2 99 %     Weight 164 lb (74.4 kg)     Height 5\' 2"  (1.575 m)     Head Circumference      Peak Flow      Pain Score 10     Pain Loc      Pain Edu?      Excl. in GC?     Constitutional: Alert and oriented. Well appearing and in no acute distress. Eyes: Conjunctivae are  normal. PERRL. EOMI. Head: Atraumatic. Nose: No congestion/rhinnorhea. Mouth/Throat: Mucous membranes are moist.  Oropharynx non-erythematous. Neck: No stridor.   Cardiovascular: Normal rate, regular rhythm. Grossly normal heart sounds.  Good peripheral circulation. Respiratory: Normal respiratory effort.  No retractions. Lungs CTAB. Gastrointestinal: Soft and minimally tender to palpation right upper quadrant without rebound or guarding. No distention. No abdominal bruits. No CVA tenderness. Musculoskeletal: No lower extremity tenderness nor edema.  No joint effusions. Neurologic:  Normal speech and language. No gross  focal neurologic deficits are appreciated. No gait instability. Skin:  Skin is warm, dry and intact. No rash noted. Psychiatric: Mood and affect are normal. Speech and behavior are normal.  ____________________________________________   LABS (all labs ordered are listed, but only abnormal results are displayed)  Labs Reviewed  COMPREHENSIVE METABOLIC PANEL - Abnormal; Notable for the following components:      Result Value   Glucose, Bld 106 (*)    All other components within normal limits  CBC - Abnormal; Notable for the following components:   WBC 10.6 (*)    RBC 5.15 (*)    All other components within normal limits  URINALYSIS, COMPLETE (UACMP) WITH MICROSCOPIC - Abnormal; Notable for the following components:   Color, Urine YELLOW (*)    APPearance HAZY (*)    All other components within normal limits  LIPASE, BLOOD  HCG, QUANTITATIVE, PREGNANCY   ____________________________________________  EKG  None ____________________________________________  RADIOLOGY  ED MD interpretation: None  Official radiology report(s): No results found.  ____________________________________________   PROCEDURES  Procedure(s) performed: None  Procedures  Critical Care performed: No  ____________________________________________   INITIAL IMPRESSION / ASSESSMENT AND PLAN / ED COURSE  As part of my medical decision making, I reviewed the following data within the electronic MEDICAL RECORD NUMBER Nursing notes reviewed and incorporated, Labs reviewed, Old chart reviewed and Notes from prior ED visits   27 year old female who presents with right upper quadrant abdominal pain and vomiting. Differential diagnosis includes, but is not limited to, biliary disease (biliary colic, acute cholecystitis, cholangitis, choledocholithiasis, etc), intrathoracic causes for epigastric abdominal pain including ACS, gastritis, duodenitis, pancreatitis, small bowel or large bowel obstruction, abdominal  aortic aneurysm, hernia, and ulcer(s).  Patient has known cholecystitis and was scheduled for surgery which she canceled because she found out she was pregnant.  She has subsequently seen the women's clinic and taken abortion pill.  Her symptoms have resolved.  Laboratory results unremarkable with normal WBC, LFTs and lipase.  Do not feel repeat ultrasound is warranted at this time.  Will discharge home on Percocet and Zofran to use as needed.  Patient is to call Dr. Adelene IdlerPiscoya's office to reschedule her gallbladder surgery.  Strict return precautions given.  Patient verbalizes understanding agrees with plan of care.      ____________________________________________   FINAL CLINICAL IMPRESSION(S) / ED DIAGNOSES  Final diagnoses:  Right upper quadrant abdominal pain  Biliary colic     ED Discharge Orders         Ordered    oxyCODONE-acetaminophen (PERCOCET/ROXICET) 5-325 MG tablet  Every 4 hours PRN     01/21/18 0312    ondansetron (ZOFRAN ODT) 4 MG disintegrating tablet  Every 8 hours PRN     01/21/18 [id number]           Note:  This document was prepared using Dragon voice recognition software and may include unintentional dictation errors.    Irean HongSung, Jade J, MD 01/21/18 [phone]

## 2018-01-21 NOTE — Discharge Instructions (Addendum)
1. Take medicines as needed for pain & nausea (Percocet/Zofran #30). 2. Clear liquids x 12 hours, then bland diet x 1 week, then slowly advance diet as tolerated. Avoid fatty, greasy, spicy foods and drinks. 3. Return to the ER for worsening symptoms, persistent vomiting, fever, difficulty breathing or other concerns.  

## 2018-02-21 ENCOUNTER — Emergency Department: Payer: Commercial Managed Care - PPO

## 2018-02-21 ENCOUNTER — Observation Stay
Admission: EM | Admit: 2018-02-21 | Discharge: 2018-02-23 | Disposition: A | Discharge status: Home / Self Care | Payer: Commercial Managed Care - PPO | Attending: Surgery | Admitting: Surgery

## 2018-02-21 ENCOUNTER — Other Ambulatory Visit: Payer: Self-pay

## 2018-02-21 DIAGNOSIS — K219 Gastro-esophageal reflux disease without esophagitis: Secondary | ICD-10-CM | POA: Diagnosis not present

## 2018-02-21 DIAGNOSIS — Z79899 Other long term (current) drug therapy: Secondary | ICD-10-CM | POA: Insufficient documentation

## 2018-02-21 DIAGNOSIS — K828 Other specified diseases of gallbladder: Secondary | ICD-10-CM | POA: Diagnosis not present

## 2018-02-21 DIAGNOSIS — K8012 Calculus of gallbladder with acute and chronic cholecystitis without obstruction: Secondary | ICD-10-CM | POA: Diagnosis not present

## 2018-02-21 DIAGNOSIS — Z8719 Personal history of other diseases of the digestive system: Secondary | ICD-10-CM

## 2018-02-21 DIAGNOSIS — M65331 Trigger finger, right middle finger: Secondary | ICD-10-CM | POA: Insufficient documentation

## 2018-02-21 DIAGNOSIS — K810 Acute cholecystitis: Secondary | ICD-10-CM | POA: Diagnosis present

## 2018-02-21 LAB — URINALYSIS, COMPLETE (UACMP) WITH MICROSCOPIC
Bilirubin Urine: NEGATIVE
Glucose, UA: NEGATIVE mg/dL
Ketones, ur: NEGATIVE mg/dL
Nitrite: NEGATIVE
Protein, ur: NEGATIVE mg/dL
Specific Gravity, Urine: 1.025 (ref 1.005–1.030)
pH: 5 (ref 5.0–8.0)

## 2018-02-21 LAB — COMPREHENSIVE METABOLIC PANEL
ALT: 13 U/L (ref 0–44)
AST: 16 U/L (ref 15–41)
Albumin: 4.7 g/dL (ref 3.5–5.0)
Alkaline Phosphatase: 72 U/L (ref 38–126)
Anion gap: 5 (ref 5–15)
BUN: 11 mg/dL (ref 6–20)
CO2: 30 mmol/L (ref 22–32)
Calcium: 9 mg/dL (ref 8.9–10.3)
Chloride: 103 mmol/L (ref 98–111)
Creatinine, Ser: 0.61 mg/dL (ref 0.44–1.00)
GFR calc Af Amer: >60 mL/min (ref >60)
GFR calc non Af Amer: >60 mL/min (ref >60)
Glucose, Bld: 107 mg/dL — ABNORMAL HIGH (ref 70–99)
Potassium: 3.7 mmol/L (ref 3.5–5.1)
Sodium: 138 mmol/L (ref 135–145)
Total Bilirubin: 0.4 mg/dL (ref 0.3–1.2)
Total Protein: 7.8 g/dL (ref 6.5–8.1)

## 2018-02-21 LAB — CBC
HCT: 44 % (ref 36.0–46.0)
Hemoglobin: 14.1 g/dL (ref 12.0–15.0)
MCH: 28 pg (ref 26.0–34.0)
MCHC: 32 g/dL (ref 30.0–36.0)
MCV: 87.3 fL (ref 80.0–100.0)
Platelets: 375 10*3/uL (ref 150–400)
RBC: 5.04 MIL/uL (ref 3.87–5.11)
RDW: 12.8 % (ref 11.5–15.5)
WBC: 16.5 10*3/uL — ABNORMAL HIGH (ref 4.0–10.5)
nRBC: 0 % (ref 0.0–0.2)

## 2018-02-21 LAB — POCT PREGNANCY, URINE: Preg Test, Ur: NEGATIVE

## 2018-02-21 LAB — LIPASE, BLOOD: Lipase: 51 U/L (ref 11–51)

## 2018-02-21 MED ORDER — ONDANSETRON HCL 4 MG/2ML IJ SOLN
4.0000 mg | Freq: Once | INTRAMUSCULAR | Status: AC
  Administered 2018-02-21: 4 mg via INTRAVENOUS
  Filled 2018-02-21: qty 2

## 2018-02-21 MED ORDER — SODIUM CHLORIDE 0.9% FLUSH
3.0000 mL | Freq: Once | INTRAVENOUS | Status: AC
  Administered 2018-02-21: 3 mL via INTRAVENOUS

## 2018-02-21 MED ORDER — FENTANYL CITRATE (PF) 100 MCG/2ML IJ SOLN
100.0000 ug | Freq: Once | INTRAMUSCULAR | Status: AC
  Administered 2018-02-21: 100 ug via INTRAVENOUS
  Filled 2018-02-21: qty 2

## 2018-02-21 MED ORDER — FENTANYL CITRATE (PF) 100 MCG/2ML IJ SOLN
50.0000 ug | Freq: Once | INTRAMUSCULAR | Status: AC
  Administered 2018-02-21: 50 ug via INTRAVENOUS
  Filled 2018-02-21: qty 2

## 2018-02-21 NOTE — ED Notes (Signed)
Patient transported to US 

## 2018-02-21 NOTE — ED Triage Notes (Signed)
Pt arrives to ED via POV from home with c/o generalized abdominal pain. Pt reports h/x of same with gall stone issues; reports supposed to have surgery but had to reschedule "because I thought I was pregnant". Pt reports nausea, 6 episodes of emesis, no diarrhea. Pt denies CP or SHOB.

## 2018-02-22 ENCOUNTER — Other Ambulatory Visit: Payer: Self-pay

## 2018-02-22 ENCOUNTER — Observation Stay: Payer: Commercial Managed Care - PPO | Admitting: Anesthesiology

## 2018-02-22 ENCOUNTER — Encounter: Admission: EM | Disposition: A | Discharge status: Home / Self Care | Payer: Self-pay | Source: Home / Self Care

## 2018-02-22 ENCOUNTER — Encounter: Payer: Self-pay | Admitting: Anesthesiology

## 2018-02-22 DIAGNOSIS — K8012 Calculus of gallbladder with acute and chronic cholecystitis without obstruction: Secondary | ICD-10-CM

## 2018-02-22 HISTORY — PX: CHOLECYSTECTOMY: SHX55

## 2018-02-22 HISTORY — DX: Calculus of gallbladder with acute and chronic cholecystitis without obstruction: K80.12

## 2018-02-22 LAB — COMPREHENSIVE METABOLIC PANEL
ALT: 12 U/L (ref 0–44)
AST: 14 U/L — ABNORMAL LOW (ref 15–41)
Albumin: 3.7 g/dL (ref 3.5–5.0)
Alkaline Phosphatase: 60 U/L (ref 38–126)
Anion gap: 4 — ABNORMAL LOW (ref 5–15)
BUN: 8 mg/dL (ref 6–20)
CO2: 28 mmol/L (ref 22–32)
Calcium: 8.5 mg/dL — ABNORMAL LOW (ref 8.9–10.3)
Chloride: 105 mmol/L (ref 98–111)
Creatinine, Ser: 0.53 mg/dL (ref 0.44–1.00)
GFR calc Af Amer: >60 mL/min (ref >60)
GFR calc non Af Amer: >60 mL/min (ref >60)
Glucose, Bld: 107 mg/dL — ABNORMAL HIGH (ref 70–99)
Potassium: 3.9 mmol/L (ref 3.5–5.1)
SODIUM: 137 mmol/L (ref 135–145)
Total Bilirubin: 0.5 mg/dL (ref 0.3–1.2)
Total Protein: 6.6 g/dL (ref 6.5–8.1)

## 2018-02-22 LAB — CBC
HCT: 40.9 % (ref 36.0–46.0)
HEMOGLOBIN: 13 g/dL (ref 12.0–15.0)
MCH: 28.2 pg (ref 26.0–34.0)
MCHC: 31.8 g/dL (ref 30.0–36.0)
MCV: 88.7 fL (ref 80.0–100.0)
Platelets: 318 10*3/uL (ref 150–400)
RBC: 4.61 MIL/uL (ref 3.87–5.11)
RDW: 13 % (ref 11.5–15.5)
WBC: 12 10*3/uL — ABNORMAL HIGH (ref 4.0–10.5)
nRBC: 0 % (ref 0.0–0.2)

## 2018-02-22 LAB — SURGICAL PCR SCREEN
MRSA, PCR: NEGATIVE
Staphylococcus aureus: NEGATIVE

## 2018-02-22 SURGERY — LAPAROSCOPIC CHOLECYSTECTOMY
Anesthesia: General | Site: Abdomen

## 2018-02-22 MED ORDER — KETOROLAC TROMETHAMINE 30 MG/ML IJ SOLN
30.0000 mg | Freq: Four times a day (QID) | INTRAMUSCULAR | Status: DC | PRN
  Administered 2018-02-22 – 2018-02-23 (×2): 30 mg via INTRAVENOUS
  Filled 2018-02-22 (×2): qty 1

## 2018-02-22 MED ORDER — ONDANSETRON HCL 4 MG/2ML IJ SOLN
INTRAMUSCULAR | Status: AC
  Filled 2018-02-22: qty 2

## 2018-02-22 MED ORDER — ACETAMINOPHEN 500 MG PO TABS: 1000.0000 mg | ORAL_TABLET | Freq: Four times a day (QID) | ORAL | Status: DC | PRN

## 2018-02-22 MED ORDER — DEXMEDETOMIDINE HCL IN NACL 200 MCG/50ML IV SOLN
INTRAVENOUS | Status: AC
  Filled 2018-02-22: qty 50

## 2018-02-22 MED ORDER — LIDOCAINE HCL 1 % IJ SOLN
INTRAMUSCULAR | Status: DC | PRN
  Administered 2018-02-22: 20 mL

## 2018-02-22 MED ORDER — MIDAZOLAM HCL 2 MG/2ML IJ SOLN
INTRAMUSCULAR | Status: AC
  Filled 2018-02-22: qty 2

## 2018-02-22 MED ORDER — FENTANYL CITRATE (PF) 100 MCG/2ML IJ SOLN
INTRAMUSCULAR | Status: DC | PRN
  Administered 2018-02-22 (×4): 50 ug via INTRAVENOUS

## 2018-02-22 MED ORDER — CEFAZOLIN SODIUM-DEXTROSE 2-3 GM-%(50ML) IV SOLR
INTRAVENOUS | Status: DC | PRN
  Administered 2018-02-22: 2 g via INTRAVENOUS

## 2018-02-22 MED ORDER — ONDANSETRON HCL 4 MG/2ML IJ SOLN: 4.0000 mg | Freq: Once | INTRAMUSCULAR | Status: DC | PRN

## 2018-02-22 MED ORDER — LIDOCAINE HCL (PF) 1 % IJ SOLN
INTRAMUSCULAR | Status: AC
  Filled 2018-02-22: qty 30

## 2018-02-22 MED ORDER — ONDANSETRON HCL 4 MG/2ML IJ SOLN
INTRAMUSCULAR | Status: DC | PRN
  Administered 2018-02-22: 4 mg via INTRAVENOUS

## 2018-02-22 MED ORDER — HYDROMORPHONE HCL 1 MG/ML IJ SOLN: 0.5000 mg | INTRAMUSCULAR | Status: DC | PRN

## 2018-02-22 MED ORDER — MIDAZOLAM HCL 2 MG/2ML IJ SOLN
INTRAMUSCULAR | Status: DC | PRN
  Administered 2018-02-22: 2 mg via INTRAVENOUS

## 2018-02-22 MED ORDER — SUCCINYLCHOLINE CHLORIDE 20 MG/ML IJ SOLN
INTRAMUSCULAR | Status: DC | PRN
  Administered 2018-02-22: 100 mg via INTRAVENOUS

## 2018-02-22 MED ORDER — CEFAZOLIN SODIUM 1 G IJ SOLR
INTRAMUSCULAR | Status: AC
  Filled 2018-02-22: qty 20

## 2018-02-22 MED ORDER — DEXMEDETOMIDINE HCL 200 MCG/2ML IV SOLN
INTRAVENOUS | Status: DC | PRN
  Administered 2018-02-22: 12 ug via INTRAVENOUS
  Administered 2018-02-22: 8 ug via INTRAVENOUS

## 2018-02-22 MED ORDER — HEPARIN SODIUM (PORCINE) 5000 UNIT/ML IJ SOLN
5000.0000 [IU] | Freq: Three times a day (TID) | INTRAMUSCULAR | Status: DC
  Administered 2018-02-22: 5000 [IU] via SUBCUTANEOUS
  Filled 2018-02-22 (×2): qty 1

## 2018-02-22 MED ORDER — DEXTROSE IN LACTATED RINGERS 5 % IV SOLN
INTRAVENOUS | Status: DC
  Administered 2018-02-22 (×2): via INTRAVENOUS

## 2018-02-22 MED ORDER — BUPIVACAINE-EPINEPHRINE (PF) 0.5% -1:200000 IJ SOLN
INTRAMUSCULAR | Status: AC
  Filled 2018-02-22: qty 90

## 2018-02-22 MED ORDER — HYDROMORPHONE HCL 1 MG/ML IJ SOLN
INTRAMUSCULAR | Status: AC
  Filled 2018-02-22: qty 1

## 2018-02-22 MED ORDER — FENTANYL CITRATE (PF) 100 MCG/2ML IJ SOLN
INTRAMUSCULAR | Status: AC
  Filled 2018-02-22: qty 2

## 2018-02-22 MED ORDER — PROPOFOL 10 MG/ML IV BOLUS
INTRAVENOUS | Status: DC | PRN
  Administered 2018-02-22: 150 mg via INTRAVENOUS

## 2018-02-22 MED ORDER — SUGAMMADEX SODIUM 200 MG/2ML IV SOLN
INTRAVENOUS | Status: DC | PRN
  Administered 2018-02-22: 200 mg via INTRAVENOUS

## 2018-02-22 MED ORDER — LACTATED RINGERS IV SOLN
INTRAVENOUS | Status: DC | PRN
  Administered 2018-02-22 (×2): via INTRAVENOUS

## 2018-02-22 MED ORDER — DEXAMETHASONE SODIUM PHOSPHATE 10 MG/ML IJ SOLN
INTRAMUSCULAR | Status: DC | PRN
  Administered 2018-02-22: 5 mg via INTRAVENOUS

## 2018-02-22 MED ORDER — BUPIVACAINE HCL (PF) 0.5 % IJ SOLN
INTRAMUSCULAR | Status: AC
  Filled 2018-02-22: qty 30

## 2018-02-22 MED ORDER — LIDOCAINE HCL (CARDIAC) PF 100 MG/5ML IV SOSY
PREFILLED_SYRINGE | INTRAVENOUS | Status: DC | PRN
  Administered 2018-02-22: 60 mg via INTRAVENOUS

## 2018-02-22 MED ORDER — PROPOFOL 10 MG/ML IV BOLUS
INTRAVENOUS | Status: AC
  Filled 2018-02-22: qty 40

## 2018-02-22 MED ORDER — MUPIROCIN 2 % EX OINT
1.0000 "application " | TOPICAL_OINTMENT | Freq: Two times a day (BID) | CUTANEOUS | Status: DC
  Filled 2018-02-22: qty 22

## 2018-02-22 MED ORDER — ACETAMINOPHEN 10 MG/ML IV SOLN
INTRAVENOUS | Status: AC
  Filled 2018-02-22: qty 100

## 2018-02-22 MED ORDER — SUGAMMADEX SODIUM 200 MG/2ML IV SOLN
INTRAVENOUS | Status: AC
  Filled 2018-02-22: qty 2

## 2018-02-22 MED ORDER — FENTANYL CITRATE (PF) 100 MCG/2ML IJ SOLN
INTRAMUSCULAR | Status: AC
  Administered 2018-02-22: 25 ug via INTRAVENOUS
  Filled 2018-02-22: qty 2

## 2018-02-22 MED ORDER — ACETAMINOPHEN 10 MG/ML IV SOLN
INTRAVENOUS | Status: DC | PRN
  Administered 2018-02-22: 1000 mg via INTRAVENOUS

## 2018-02-22 MED ORDER — OXYCODONE HCL 5 MG PO TABS
5.0000 mg | ORAL_TABLET | ORAL | Status: DC | PRN
  Administered 2018-02-22 – 2018-02-23 (×3): 10 mg via ORAL
  Filled 2018-02-22 (×3): qty 2

## 2018-02-22 MED ORDER — ROCURONIUM BROMIDE 100 MG/10ML IV SOLN
INTRAVENOUS | Status: DC | PRN
  Administered 2018-02-22: 25 mg via INTRAVENOUS
  Administered 2018-02-22: 5 mg via INTRAVENOUS
  Administered 2018-02-22 (×2): 10 mg via INTRAVENOUS

## 2018-02-22 MED ORDER — PROPOFOL 500 MG/50ML IV EMUL
INTRAVENOUS | Status: DC | PRN
  Administered 2018-02-22: 25 ug/kg/min via INTRAVENOUS

## 2018-02-22 MED ORDER — FENTANYL CITRATE (PF) 100 MCG/2ML IJ SOLN
25.0000 ug | INTRAMUSCULAR | Status: AC | PRN
  Administered 2018-02-22 (×6): 25 ug via INTRAVENOUS

## 2018-02-22 SURGICAL SUPPLY — 39 items
APPLIER CLIP ROT 10 11.4 M/L (STAPLE) ×3
CHLORAPREP W/TINT 26ML (MISCELLANEOUS) ×3 IMPLANT
CLIP APPLIE ROT 10 11.4 M/L (STAPLE) ×1 IMPLANT
COVER WAND RF STERILE (DRAPES) ×3 IMPLANT
DECANTER SPIKE VIAL GLASS SM (MISCELLANEOUS) ×9 IMPLANT
DERMABOND ADVANCED (GAUZE/BANDAGES/DRESSINGS) ×2
DERMABOND ADVANCED .7 DNX12 (GAUZE/BANDAGES/DRESSINGS) ×1 IMPLANT
DRESSING SURGICEL FIBRLLR 1X2 (HEMOSTASIS) IMPLANT
DRSG SURGICEL FIBRILLAR 1X2 (HEMOSTASIS)
ELECT REM PT RETURN 9FT ADLT (ELECTROSURGICAL) ×3
ELECTRODE REM PT RTRN 9FT ADLT (ELECTROSURGICAL) ×1 IMPLANT
GLOVE BIO SURGEON STRL SZ7 (GLOVE) ×3 IMPLANT
GLOVE BIOGEL PI IND STRL 7.5 (GLOVE) ×1 IMPLANT
GLOVE BIOGEL PI INDICATOR 7.5 (GLOVE) ×2
GOWN STRL REUS W/ TWL LRG LVL3 (GOWN DISPOSABLE) ×3 IMPLANT
GOWN STRL REUS W/TWL LRG LVL3 (GOWN DISPOSABLE) ×6
GRASPER SUT TROCAR 14GX15 (MISCELLANEOUS) ×3 IMPLANT
IRRIGATION STRYKERFLOW (MISCELLANEOUS) IMPLANT
IRRIGATOR STRYKERFLOW (MISCELLANEOUS)
IV NS 1000ML (IV SOLUTION) ×2
IV NS 1000ML BAXH (IV SOLUTION) ×1 IMPLANT
KIT TURNOVER KIT A (KITS) ×3 IMPLANT
LABEL OR SOLS (LABEL) ×3 IMPLANT
NEEDLE HYPO 22GX1.5 SAFETY (NEEDLE) ×3 IMPLANT
NEEDLE INSUFFLATION 14GA 120MM (NEEDLE) ×3 IMPLANT
NS IRRIG 1000ML POUR BTL (IV SOLUTION) ×3 IMPLANT
PACK LAP CHOLECYSTECTOMY (MISCELLANEOUS) ×3 IMPLANT
PORT ACCESS TROCAR AIRSEAL 12 (TROCAR) ×1 IMPLANT
PORT ACCESS TROCAR AIRSEAL 5M (TROCAR) ×2
POUCH SPECIMEN RETRIEVAL 10MM (ENDOMECHANICALS) ×3 IMPLANT
SCISSORS METZENBAUM CVD 33 (INSTRUMENTS) IMPLANT
SET TRI-LUMEN FLTR TB AIRSEAL (TUBING) ×3 IMPLANT
SLEEVE ENDOPATH XCEL 5M (ENDOMECHANICALS) ×6 IMPLANT
SLEEVE PROTECTION STRL DISP (MISCELLANEOUS) ×3 IMPLANT
SUT MNCRL AB 4-0 PS2 18 (SUTURE) ×3 IMPLANT
SUT VICRYL 0 AB UR-6 (SUTURE) ×3 IMPLANT
SUT VICRYL 0 UR6 27IN ABS (SUTURE) ×3 IMPLANT
SUT VICRYL AB 3-0 FS1 BRD 27IN (SUTURE) ×3 IMPLANT
TROCAR XCEL NON-BLD 5MMX100MML (ENDOMECHANICALS) ×3 IMPLANT

## 2018-02-22 NOTE — H&P (Signed)
SURGICAL HISTORY & PHYSICAL (cpt [phone])  HISTORY OF PRESENT ILLNESS (HPI):  28 y.o. female presented to [facility] ED tonight for RUQ abdominal pain. Patient reports she developed severe RUQ abdominal pain and nausea without emesis yestserday evening after she ate french fries and hot dogs at 4:30 pm yesterday afternoon. Patient (and review of her chart) describe that she was scheduled for laparoscopic cholecystectomy 1 month and 1 day ago until she found out she was again [redacted] weeks pregnant after having given birth to her first and only daughter this past June following 2 prior miscarriages. Patient then took Mifepristone to induce abortion, but has not yet, however, scheduled outpatient surgical follow-up. She says she has since experienced multiple additional episodes of post-prandial RUQ abdominal pain after eating meat, cheese, dairy (such as whole milk on cereal), and fried foods, but describes the pain she had yesterday was the worst she's had since last month. Her first episode of post-prandial RUQ abdominal pain followed the birth of her daughter. She currently says her pain has nearly resolved, and she denies any fever/chills, CP, or SOB.  PAST MEDICAL HISTORY (PMH):  Past Medical History:  Diagnosis Date  . Acid reflux disease     Reviewed. Otherwise negative.   PAST SURGICAL HISTORY (PSH):  Past Surgical History:  Procedure Laterality Date  . DILATION AND CURETTAGE OF UTERUS  2013, 2011,  . KNEE SURGERY Left     Reviewed. Otherwise negative.   MEDICATIONS:  Prior to Admission medications   Medication Sig Start Date End Date Taking? Authorizing Provider  oxyCODONE-acetaminophen (PERCOCET/ROXICET) 5-325 MG tablet Take 1 tablet by mouth every 4 (four) hours as needed for severe pain. 01/21/18  Yes Irean HongSung, Jade J, MD  ondansetron (ZOFRAN ODT) 4 MG disintegrating tablet Take 1 tablet (4 mg total) by mouth every 8 (eight) hours as needed for nausea or vomiting. 01/21/18   Irean HongSung, Jade J, MD     ALLERGIES:  No Known Allergies   SOCIAL HISTORY:  Social History   Socioeconomic History  . Marital status: Single    Spouse name: Not on file  . Number of children: Not on file  . Years of education: Not on file  . Highest education level: Not on file  Occupational History  . Not on file  Social Needs  . Financial resource strain: Not on file  . Food insecurity:    Worry: Not on file    Inability: Not on file  . Transportation needs:    Medical: Not on file    Non-medical: Not on file  Tobacco Use  . Smoking status: Never Smoker  . Smokeless tobacco: Never Used  Substance and Sexual Activity  . Alcohol use: No  . Drug use: No  . Sexual activity: Not on file  Lifestyle  . Physical activity:    Days per week: Not on file    Minutes per session: Not on file  . Stress: Not on file  Relationships  . Social connections:    Talks on phone: Not on file    Gets together: Not on file    Attends religious service: Not on file    Active member of club or organization: Not on file    Attends meetings of clubs or organizations: Not on file    Relationship status: Not on file  . Intimate partner violence:    Fear of current or ex partner: Not on file    Emotionally abused: Not on file    Physically abused:  Not on file    Forced sexual activity: Not on file  Other Topics Concern  . Not on file  Social History Narrative  . Not on file    The patient currently resides (home / rehab facility / nursing home): Home The patient normally is (ambulatory / bedbound): Ambulatory  FAMILY HISTORY:  Family History  Problem Relation Age of Onset  . Gallbladder disease Mother   . Hypertension Father     Otherwise negative.   REVIEW OF SYSTEMS:  Constitutional: denies any other weight loss, fever, chills, or sweats  Eyes: denies any other vision changes, history of eye injury  ENT: denies sore throat, hearing problems  Respiratory: denies shortness of breath, wheezing   Cardiovascular: denies chest pain, palpitations  Gastrointestinal: abdominal pain, N/V, and bowel function as per HPI  Genitourinary: denies burning with urination or urinary frequency Musculoskeletal: denies any other joint pains or cramps  Skin: Denies any other rashes or skin discolorations  Neurological: denies any other headache, dizziness, weakness  Psychiatric: denies any other depression, anxiety   All other review of systems were otherwise negative.  VITAL SIGNS:  Temp:  [97.5 F (36.4 C)] 97.5 F (36.4 C) (01/27 2221) Pulse Rate:  [65-93] 65 (01/28 0048) Resp:  [16-20] 16 (01/28 0048) BP: (106-131)/(65-92) 106/65 (01/28 0048) SpO2:  [99 %] 99 % (01/28 0048) Weight:  [70.3 kg] 70.3 kg (01/27 2222)     Height: 5\' 2"  (157.5 cm) Weight: 70.3 kg BMI (Calculated): 28.34   INTAKE/OUTPUT:  This shift: No intake/output data recorded.  Last 2 shifts: @IOLAST2SHIFTS @  PHYSICAL EXAM:  Constitutional:  -- Normal body habitus  -- Awake, alert, and oriented x3, no apparent distress Eyes:  -- Pupils equally round and reactive to light  -- No scleral icterus, B/L no occular discharge Ear, nose, throat: -- Neck is FROM WNL -- No jugular venous distension  Pulmonary:  -- No wheezes or rhales -- Equal breath sounds bilaterally -- Breathing non-labored at rest Cardiovascular:  -- S1, S2 present  -- No pericardial rubs  Gastrointestinal:  -- Abdomen soft and non-distended with minimal RUQ abdominal "discomfort" to deep palpation, no guarding or rebound tenderness -- No abdominal masses appreciated, pulsatile or otherwise  Musculoskeletal and Integumentary:  -- Wounds or skin discoloration: None appreciated -- Extremities: B/L UE and LE FROM, hands and feet warm, no edema  Neurologic:  -- Motor function: Intact and symmetric -- Sensation: Intact and symmetric Psychiatric:  -- Mood and affect WNL  Labs:  CBC Latest Ref Rng & Units 02/21/2018 01/21/2018 12/09/2017  WBC 4.0 -  10.5 K/uL 16.5(H) 10.6(H) 10.6(H)  Hemoglobin 12.0 - 15.0 g/dL 40.914.1 81.114.7 91.414.0  Hematocrit 36.0 - 46.0 % 44.0 43.7 40.5  Platelets 150 - 400 K/uL 375 361 295   CMP Latest Ref Rng & Units 02/21/2018 01/21/2018 12/09/2017  Glucose 70 - 99 mg/dL 782(N107(H) 562(Z106(H) 308(M104(H)  BUN 6 - 20 mg/dL 11 9 16   Creatinine 0.44 - 1.00 mg/dL 5.780.61 4.690.57 6.290.58  Sodium 135 - 145 mmol/L 138 137 136  Potassium 3.5 - 5.1 mmol/L 3.7 3.7 3.7  Chloride 98 - 111 mmol/L 103 107 105  CO2 22 - 32 mmol/L 30 24 27   Calcium 8.9 - 10.3 mg/dL 9.0 9.4 5.2(W8.7(L)  Total Protein 6.5 - 8.1 g/dL 7.8 7.7 7.1  Total Bilirubin 0.3 - 1.2 mg/dL 0.4 0.6 0.6  Alkaline Phos 38 - 126 U/L 72 61 70  AST 15 - 41 U/L 16 24 16   ALT  0 - 44 U/L 13 33 15   Imaging studies:  Limited RUQ Abdominal Ultrasound (02/21/2018) Shadowing non mobile 2.4 cm stone in the gallbladder neck.  Diffuse gallbladder wall thickening of 4 mm. Intraluminal sludge in gallbladder. Areas of ring down artifact suggesting adenomyomatosis. Patient pre-medicated prior to the exam limiting assessment for sonographic Murphy sign. No pericholecystic fluid. Common  bile duct diameter: 6 mm (upper normal), previously 5 mm.  Assessment/Plan: (ICD-10's: K75.12) 28 y.o. female with likely acute on chronic cholecystitis, complicated by pertinent comorbidities including GERD.    - NPO for now, IV fluids   - will follow up repeat CBC and CMP tomorrow morning  - all risks, benefits, and alternatives to cholecystectomy were discussed with the patient, all of her questions were answered to her expressed satisfaction, patient expresses she wishes to proceed, and informed consent was obtained.   - will plan for laparoscopic cholecystectomy tomorrow pending anesthesia and OR availability   - DVT prophylaxis, ambulation encouraged  All of the above findings and recommendations were discussed with the patient, and all of her questions were answered to her expressed satisfaction.  -- Scherrie Gerlach  Earlene Plater, MD, RPVI [place]: [facility] General Surgery - Partnering for exceptional care. Office: [phone]

## 2018-02-22 NOTE — Transfer of Care (Signed)
Immediate Anesthesia Transfer of Care Note  Patient: Maria Hansen  Procedure(s) Performed: LAPAROSCOPIC CHOLECYSTECTOMY (N/A Abdomen)  Patient Location: PACU  Anesthesia Type:General  Level of Consciousness: drowsy  Airway & Oxygen Therapy: Patient Spontanous Breathing and Patient connected to face mask oxygen  Post-op Assessment: Report given to RN and Post -op Vital signs reviewed and stable  Post vital signs: stable  Last Vitals:  Vitals Value Taken Time  BP 114/63 02/22/2018  2:09 PM  Temp 36.4 C 02/22/2018  2:09 PM  Pulse 76 02/22/2018  2:12 PM  Resp 18 02/22/2018  2:12 PM  SpO2 100 % 02/22/2018  2:12 PM  Vitals shown include unvalidated device data.  Last Pain:  Vitals:   02/22/18 1409  TempSrc:   PainSc: Asleep      Patients Stated Pain Goal: 0 (02/22/18 0048)  Complications: No apparent anesthesia complications

## 2018-02-22 NOTE — ED Notes (Signed)
Report given to Henry, RN

## 2018-02-22 NOTE — ED Provider Notes (Signed)
[facility] Emergency Department Provider Note  ____________________________________________   None    (approximate)  I have reviewed the triage vital signs and the nursing notes.   HISTORY  Chief Complaint Abdominal Pain   HPI Maria Hansen is a 28 y.o. female who presents to the emergency department for treatment and evaluation of right upper quadrant abdominal pain. She has known gallstones. She has been able to manage the pain and symptoms until today. She had a cholecystectomy scheduled, but found out she was pregnant. She decided to terminate the pregnancy and planned to reschedule the surgery. She had not yet rescheduled, but did take the "abortion pill." She has been unable to keep any food or fluids down since 11:00 this morning.   Past Medical History:  Diagnosis Date  . Acid reflux disease     Patient Active Problem List   Diagnosis Date Noted  . Calculus of gallbladder with acute and chronic cholecystitis without obstruction 02/22/2018  . Trigger middle finger of right hand 12/09/2017  . Calculus of gallbladder without cholecystitis without obstruction 10/12/2017  . Gastroesophageal reflux disease 10/12/2017  . 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 DILATION AND CURETTAGE OF UTERUS  2013, 2011,  . KNEE SURGERY Left     Prior to Admission medications   Medication Sig Start Date End Date Taking? Authorizing Provider  oxyCODONE-acetaminophen (PERCOCET/ROXICET) 5-325 MG tablet Take 1 tablet by mouth every 4 (four) hours as needed for severe pain. 01/21/18  Yes Irean HongSung, Jade J, MD  ondansetron (ZOFRAN ODT) 4 MG disintegrating tablet Take 1 tablet (4 mg total) by mouth every 8 (eight) hours as needed for nausea or vomiting. 01/21/18   Irean HongSung, Jade J, MD    Allergies Patient has no known allergies.  Family History  Problem Relation Age of Onset  . Gallbladder  disease Mother   . Hypertension Father     Social History Social History   Tobacco Use  . Smoking status: Never Smoker  . Smokeless tobacco: Never Used  Substance Use Topics  . Alcohol use: No  . Drug use: No    Review of Systems  Constitutional: No fever/chills Eyes: No visual changes. ENT: No sore throat. Cardiovascular: Denies chest pain. Respiratory: Denies shortness of breath. Gastrointestinal: Right upper quadrant abdominal pain.  Positive for nausea, positive for vomiting.  No diarrhea.  No constipation. Genitourinary: Negative for dysuria. Musculoskeletal: Negative for back pain. Skin: Negative for rash. Neurological: Negative for headaches, focal weakness or numbness. ____________________________________________   PHYSICAL EXAM:  VITAL SIGNS: ED Triage Vitals  Enc Vitals Group     BP 02/21/18 2221 (!) 131/92     Pulse Rate 02/21/18 2221 93     Resp 02/21/18 2221 20     Temp 02/21/18 2221 (!) 97.5 F (36.4 C)     Temp Source 02/21/18 2221 Oral     SpO2 02/21/18 2221 99 %     Weight 02/21/18 2222 155 lb (70.3 kg)     Height 02/21/18 2222 5\' 2"  (1.575 m)     Head Circumference --      Peak Flow --      Pain Score 02/21/18 2222 10     Pain Loc --      Pain Edu? --      Excl. in GC? --     Constitutional: Alert and oriented. Well appearing and in no acute distress. Eyes: Conjunctivae are  normal. Head: Atraumatic. Nose: No congestion/rhinnorhea. Mouth/Throat: Mucous membranes are moist.  Oropharynx non-erythematous. Neck: No stridor.   Cardiovascular: Normal rate, regular rhythm. Grossly normal heart sounds. Good peripheral circulation. Respiratory: Normal respiratory effort.  No retractions. Lungs CTAB. Gastrointestinal: Soft and tender in the right upper quadrant. Musculoskeletal: No lower extremity tenderness nor edema.  No joint effusions. Neurologic:  Normal speech and language. No gross focal neurologic deficits are appreciated. No gait  instability. Skin:  Skin is warm, dry and intact. No rash noted. Psychiatric: Mood and affect are normal. Speech and behavior are normal. ___________________________________________   LABS (all labs ordered are listed, but only abnormal results are displayed)  Labs Reviewed  COMPREHENSIVE METABOLIC PANEL - Abnormal; Notable for the following components:      Result Value   Glucose, Bld 107 (*)    All other components within normal limits  CBC - Abnormal; Notable for the following components:   WBC 16.5 (*)    All other components within normal limits  URINALYSIS, COMPLETE (UACMP) WITH MICROSCOPIC - Abnormal; Notable for the following components:   Color, Urine YELLOW (*)    APPearance CLOUDY (*)    Hgb urine dipstick MODERATE (*)    Leukocytes, UA SMALL (*)    Bacteria, UA RARE (*)    All other components within normal limits  LIPASE, BLOOD  HIV ANTIBODY (ROUTINE TESTING W REFLEX)  COMPREHENSIVE METABOLIC PANEL  CBC  POC URINE PREG, ED  POCT PREGNANCY, URINE   ____________________________________________  EKG  Not indicated. ____________________________________________  RADIOLOGY  ED MD interpretation:  2.4cm non mobile stone in the gallbladder neck with diffuse wall thickening and sludge.  Official radiology report(s): US Abdomen Limited Ruq  Result Date: 02/21/2018 CLINICAL DATA:  Abdominal pain with nausea and vomiting. History of gallstones. EXAM: ULTRASOUND ABDOMEN LIMITED RIGHT UPPER QUADRANT COMPARISON:  None. FINDINGS: Gallbladder: Shadowing non mobile 2.4 cm stone in the gallbladder neck. Diffuse gallbladder wall thickening of 4 mm. Intraluminal sludge in gallbladder. Areas of ring down artifact suggesting adenomyomatosis. Patient pre-medicated prior to the exam limiting assessment for sonographic Murphy sign. No pericholecystic fluid. Common bile duct: Diameter: 6 mm, upper normal, previously 5 mm. Liver: No focal lesion identified. Within normal limits in  parenchymal echogenicity. Portal vein is patent on color Doppler imaging with normal direction of blood flow towards the liver. IMPRESSION: 1. Gallstone in the gallbladder neck with gallbladder wall thickening suspicious for acute cholecystitis. 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 2. Upper normal common bile duct but similar to prior exam. Electronically Signed   By: Narda Rutherford M.D.   On: 02/21/2018 23:48    ____________________________________________   PROCEDURES  Procedure(s) performed: None  Procedures  Critical Care performed: No  ____________________________________________   INITIAL IMPRESSION / ASSESSMENT AND PLAN / ED COURSE  As part of my medical decision making, I reviewed the following data within the electronic MEDICAL RECORD NUMBER Labs reviewed WBC 71.71.    28 year old female presenting to the emergency department for treatment and evaluation of right upper quadrant pain with known cholecystitis. She has been given zofran and fentanyl with some relief. She is resting with eyes closed. Awaiting [place] results.  Patient to be admitted by Dr. Earlene Plater who has evaluated her here in the ER. Pain is well controlled currently and she continues to deny nausea.      ____________________________________________   FINAL CLINICAL IMPRESSION(S) / ED DIAGNOSES  Final diagnoses:  Hx of gallstones  Acute cholecystitis     ED Discharge Orders  None       Note:  This document was prepared using Dragon voice recognition software and may include unintentional dictation errors.    Chinita Pesterriplett, Kroy Sprung B, FNP 02/22/18 0330    Darci CurrentBrown, [place] N, MD 02/22/18 [id number]

## 2018-02-22 NOTE — Anesthesia Postprocedure Evaluation (Signed)
Anesthesia Post Note  Patient: Maria Hansen  Procedure(s) Performed: LAPAROSCOPIC CHOLECYSTECTOMY (N/A Abdomen)  Patient location during evaluation: PACU Anesthesia Type: General Level of consciousness: awake and alert Pain management: pain level controlled Vital Signs Assessment: post-procedure vital signs reviewed and stable Respiratory status: spontaneous breathing, nonlabored ventilation, respiratory function stable and patient connected to nasal cannula oxygen Cardiovascular status: blood pressure returned to baseline and stable Postop Assessment: no apparent nausea or vomiting Anesthetic complications: no     Last Vitals:  Vitals:   02/22/18 1518 02/22/18 1700  BP: 110/65 (!) 127/110  Pulse: 71 70  Resp: 16 18  Temp: 36.9 C 36.9 C  SpO2: 98% 98%    Last Pain:  Vitals:   02/22/18 1700  TempSrc: Oral  PainSc:                  Rodrickus Min S

## 2018-02-22 NOTE — Progress Notes (Signed)
[facility] SURGICAL PROGRESS NOTE  Hospital Day(s): 0.   Post op day(s):  Marland Kitchen   Interval History: Patient seen and examined, no acute events or new complaints overnight. Patient reports significant improvement in her abdominal pain. No fever, chills, nausea, or emesis. currently NPO.   Review of Systems:  Constitutional: denies fever, chills  Respiratory: denies any shortness of breath  Cardiovascular: denies chest pain or palpitations  Gastrointestinal: denies abdominal pain, N/V, or diarrhea/and bowel function as per interval history   Vital signs in last 24 hours: [min-max] current  Temp:  [97.5 F (36.4 C)] 97.5 F (36.4 C) (01/28 0502) Pulse Rate:  [56-93] 56 (01/28 0502) Resp:  [16-20] 16 (01/28 0502) BP: (103-131)/(54-92) 105/64 (01/28 0502) SpO2:  [97 %-100 %] 100 % (01/28 0502) Weight:  [70.3 kg] 70.3 kg (01/27 2222)     Height: 5\' 2"  (157.5 cm) Weight: 70.3 kg BMI (Calculated): 28.34   Intake/Output this shift:  No intake/output data recorded.   Intake/Output last 2 shifts:  @IOLAST2SHIFTS @   Physical Exam:  Constitutional: alert, cooperative and no distress  Respiratory: breathing non-labored at rest  Gastrointestinal: soft, non-tender, and non-distended  Labs:  CBC Latest Ref Rng & Units 02/22/2018 02/21/2018 01/21/2018  WBC 4.0 - 10.5 K/uL 12.0(H) 16.5(H) 10.6(H)  Hemoglobin 12.0 - 15.0 g/dL 78.2 95.6 21.3  Hematocrit 36.0 - 46.0 % 40.9 44.0 43.7  Platelets 150 - 400 K/uL 318 375 361   CMP Latest Ref Rng & Units 02/22/2018 02/21/2018 01/21/2018  Glucose 70 - 99 mg/dL 086(V) 784(O) 962(X)  BUN 6 - 20 mg/dL 8 11 9   Creatinine 0.44 - 1.00 mg/dL 5.28 4.13 2.44  Sodium 135 - 145 mmol/L 137 138 137  Potassium 3.5 - 5.1 mmol/L 3.9 3.7 3.7  Chloride 98 - 111 mmol/L 105 103 107  CO2 22 - 32 mmol/L 28 30 24   Calcium 8.9 - 10.3 mg/dL 0.1(U) 9.0 9.4  Total Protein 6.5 - 8.1 g/dL 6.6 7.8 7.7  Total Bilirubin 0.3 - 1.2 mg/dL 0.5 0.4 0.6  Alkaline Phos  38 - 126 U/L 60 72 61  AST 15 - 41 U/L 14(L) 16 24  ALT 0 - 44 U/L 12 13 33    Imaging studies: No new pertinent imaging studies   Assessment/Plan: (ICD-10's: K68.12) 28 y.o. female with improved abdominal pain secondary to acute on chronic cholecystitis scheduled for laparoscopic cholecystectomy with Dr Earlene Plater this afternoon, complicated by pertinent comorbidities including GERD.   - NPO + IVF  - Pain control as needed (minimize narcotics)  - Continue to monitor abdominal examination  - Plan for laparoscopic cholecystectomy with Dr. Earlene Plater this afternoon.    - All risks, benefits, and alternatives to above procedure were discussed with the patient, and all of her questions were answered to her expressed satisfaction, patient expresses she wishes to proceed, and informed consent was obtained.  - Mobilization encouraged   - DVT Prophylaxis   All of the above findings and recommendations were discussed with the patient, and the medical team, and all of patient's questions were answered to her expressed satisfaction.  -- Lynden Oxford, PA-C [facility] 02/22/2018, 9:13 AM [phone] M-F: 7am - 4pm

## 2018-02-22 NOTE — Op Note (Signed)
SURGICAL OPERATIVE REPORT   DATE OF PROCEDURE: 02/22/2018  ATTENDING Surgeon(s): Ancil Linsey, MD  ASSISTANT(S): Gillermina Phy, PA-C   ANESTHESIA: GETA  PRE-OPERATIVE DIAGNOSIS: Acute on Chronic Cholecystitis (K80.12)  POST-OPERATIVE DIAGNOSIS: Acute on Chronic Cholecystitis (K80.12)  PROCEDURE(S): (cpt's: 47562) 1.) Laparoscopic Cholecystectomy  INTRAOPERATIVE FINDINGS: moderate uniform pericholecystic inflammation with cystic duct and cystic artery clips well-secured, hemostasis at completion of procedure  INTRAOPERATIVE FLUIDS: 1000 mL crystalloid   ESTIMATED BLOOD LOSS: Minimal (<30 mL)   URINE OUTPUT: No foley  SPECIMENS: Gallbladder  IMPLANTS: None  DRAINS: None   COMPLICATIONS: None apparent   CONDITION AT COMPLETION: Hemodynamically stable and extubated  DISPOSITION: PACU   INDICATION(S) FOR PROCEDURE:  Patient is a 28 y.o. female who this admission presented with post-prandial RUQ > epigastric abdominal pain after eating fatty foods in particular. Ultrasound suggested acute calculous cholecystitis. All risks, benefits, and alternatives to above elective procedures were discussed with the patient, who elected to proceed, and informed consent was accordingly obtained at that time.   DETAILS OF PROCEDURE:  Patient was brought to the operating suite and appropriately identified. General anesthesia was administered along with peri-operative prophylactic IV antibiotics, and endotracheal intubation was performed by anesthesiologist, along with NG/OG tube for gastric decompression. In supine position, operative site was prepped and draped in usual sterile fashion, and following a brief time out, initial 5 mm incision was made in a natural skin crease just above the umbilicus. Fascia was then elevated, and a Verress needle was inserted and its proper position confirmed using aspiration and saline meniscus test.  Upon insufflation of the abdominal cavity with carbon  dioxide to a well-tolerated pressure of 12-15 mmHg, 5 mm peri-umbilical port followed by laparoscope were inserted and used to inspect the abdominal cavity and its contents with no injuries from insertion of the first trochar noted. Three additional trocars were inserted, one at the epigastric position (10 mm) and two along the Right costal margin (5 mm). The table was then placed in reverse Trendelenburg position with the Right side up. Filmy adhesions between the gallbladder and omentum/duodenum/transverse colon were lysed using combined blunt dissection and selective electrocautery. The apex/dome of the gallbladder was grasped with an atraumatic grasper passed through the lateral port and retracted apically over the liver. The infundibulum was also grasped and retracted, exposing Calot's triangle. The peritoneum overlying the gallbladder infundibulum was incised and dissected free of surrounding peritoneal attachments, revealing the cystic duct and cystic artery, which were clipped twice on the patient side and once on the gallbladder specimen side close to the gallbladder. The gallbladder was then dissected from its peritoneal attachments to the liver using electrocautery, and the gallbladder was placed into a laparoscopic specimen bag and removed from the abdominal cavity via the epigastric port site. Hemostasis and secure placement of clips were confirmed, and intra-peritoneal cavity was inspected with no additional findings. PMI laparoscopic fascial closure device was then used to re-approximate fascia at the 10 mm epigastric port site.  All ports were then removed under direct visualization, and abdominal cavity was desuflated. All port sites were irrigated/cleaned, additional local anesthetic was injected at each incision, 3-0 Vicryl was used to re-approximate dermis at 10 mm port site(s), and subcuticular 4-0 Monocryl suture was used to re-approximate skin. Skin was then cleaned, dried, and sterile skin  glue was applied. Patient was then safely able to be awakened, extubated, and transferred to PACU for post-operative monitoring and care.   I was present for all aspects of  the above procedure, and no operative complications were apparent.

## 2018-02-22 NOTE — Anesthesia Preprocedure Evaluation (Signed)
Anesthesia Evaluation  Patient identified by MRN, date of birth, ID band Patient awake    Reviewed: Allergy & Precautions, NPO status , Patient's Chart, lab work & pertinent test results, reviewed documented beta blocker date and time   Airway Mallampati: II  TM Distance: >3 FB     Dental  (+) Chipped   Pulmonary           Cardiovascular      Neuro/Psych    GI/Hepatic GERD  Controlled,  Endo/Other    Renal/GU      Musculoskeletal   Abdominal   Peds  Hematology   Anesthesia Other Findings   Reproductive/Obstetrics                             Anesthesia Physical Anesthesia Plan  ASA: II  Anesthesia Plan: General   Post-op Pain Management:    Induction: Intravenous  PONV Risk Score and Plan:   Airway Management Planned: Oral ETT  Additional Equipment:   Intra-op Plan:   Post-operative Plan:   Informed Consent: I have reviewed the patients History and Physical, chart, labs and discussed the procedure including the risks, benefits and alternatives for the proposed anesthesia with the patient or authorized representative who has indicated his/her understanding and acceptance.       Plan Discussed with: CRNA  Anesthesia Plan Comments:         Anesthesia Quick Evaluation

## 2018-02-22 NOTE — ED Notes (Signed)
Surgeon at bedside.  

## 2018-02-22 NOTE — Anesthesia Procedure Notes (Addendum)
Procedure Name: Intubation Date/Time: 02/22/2018 12:23 PM Performed by: Dionne Bucy, CRNA Pre-anesthesia Checklist: Patient identified, Emergency Drugs available, Suction available, Patient being monitored and Timeout performed Patient Re-evaluated:Patient Re-evaluated prior to induction Oxygen Delivery Method: Circle system utilized Preoxygenation: Pre-oxygenation with 100% oxygen Induction Type: IV induction Ventilation: Mask ventilation without difficulty Laryngoscope Size: Mac and 3 Grade View: Grade I Tube type: Oral Tube size: 7.0 mm Airway Equipment and Method: Stylet Placement Confirmation: ETT inserted through vocal cords under direct vision,  positive ETCO2,  CO2 detector and breath sounds checked- equal and bilateral Secured at: 21 cm

## 2018-02-22 NOTE — Anesthesia Post-op Follow-up Note (Signed)
Anesthesia QCDR form completed.        

## 2018-02-23 ENCOUNTER — Encounter: Payer: Self-pay | Admitting: Surgery

## 2018-02-23 LAB — HIV ANTIBODY (ROUTINE TESTING W REFLEX): HIV Screen 4th Generation wRfx: NONREACTIVE

## 2018-02-23 MED ORDER — OXYCODONE HCL 5 MG PO TABS: 5.0000 mg | ORAL_TABLET | Freq: Four times a day (QID) | ORAL | 0 refills | Status: DC | PRN

## 2018-02-23 NOTE — Discharge Summary (Signed)
[facility] SUR[facility]ICAL DISCHARGE SUMMARY  Patient ID: Maria Hansen MRN: [id number] DOB/AGE: 1990/05/13 28 y.o.  Admit date: 02/21/2018 Discharge date: 02/23/2018  Discharge Diagnoses Calculus of gallbladder with acute and chronic cholecystitis without obstruction  Consultants None  Procedures -- 02/23/2018:  Laparoscopic Cholecystectomy  HPI: 28 y.o. female presented to [facility] ED tonight for RUQ abdominal pain. Patient reports she developed severe RUQ abdominal pain and nausea without emesis yestserday evening after she ate french fries and hot dogs at 4:30 pm yesterday afternoon. Patient (and review of her chart) describe that she was scheduled for laparoscopic cholecystectomy 1 month and 1 day ago until she found out she was again [redacted] weeks pregnant after having given birth to her first and only daughter this past June following 2 prior miscarriages. Patient then took Mifepristone to induce abortion, but has not yet, however, scheduled outpatient surgical follow-up. She says she has since experienced multiple additional episodes of post-prandial RUQ abdominal pain after eating meat, cheese, dairy (such as whole milk on cereal), and fried foods, but describes the pain she had yesterday was the worst she's had since last month. Her first episode of post-prandial RUQ abdominal pain followed the birth of her daughter. She currently says her pain has nearly resolved, and she denies any fever/chills, CP, or SOB  Hospital Course: Workup was found to be significant for imaging demonstrating acute on chronic cholecystitis. Informed consent was obtained and documented, and patient underwent uneventful laparoscopic cholecystectomy (Dr Earlene Plater, 02/22/2018).  Post-operatively, patient's pain improved/resolved and advancement of patient's diet and ambulation were well-tolerated. The remainder of patient's hospital course was essentially unremarkable, and discharge planning was initiated  accordingly with patient safely able to be discharged home with appropriate discharge instructions, pain control, and outpatient follow-up after all of her questions were answered to her expressed satisfaction.  Discharge Condition: Good   Physical Examination:  Constitutional: well appearing female, NAD Pulmonary: Normal effort, no respiratory distress Gastrointestinal: Soft, incisional tenderness, non-distended Skin: Laparoscopic incisions are CDI, no erythema, no drainage   Allergies as of 02/23/2018   No Known Allergies     Medication List    STOP taking these medications   oxyCODONE-acetaminophen 5-325 MG tablet Commonly known as:  PERCOCET/ROXICET     TAKE these medications   ondansetron 4 MG disintegrating tablet Commonly known as:  ZOFRAN ODT Take 1 tablet (4 mg total) by mouth every 8 (eight) hours as needed for nausea or vomiting.   oxyCODONE 5 MG immediate release tablet Commonly known as:  Oxy IR/ROXICODONE Take 1 tablet (5 mg total) by mouth every 6 (six) hours as needed for severe pain or breakthrough pain.        Follow-up Information    Ancil Linsey, MD. Schedule an appointment as soon as possible for a visit in 2 week(s).   Specialty:  General Surgery Contact information: [address] [place] [address] [phone]            -- Lynden Oxford , PA-C [facility]  02/23/2018, 8:25 AM [phone] M-F: 7am - 4pm

## 2018-02-23 NOTE — Discharge Instructions (Signed)
In addition to included general post-operative instructions for laparoscopic cholecystectomy (gallbladder removal),  Diet: Resume home diet.   Activity: No heavy lifting >20 pounds (children, pets, laundry, garbage) or strenuous activity until follow-up, but light activity and walking are encouraged. Do not drive or drink alcohol if taking narcotic pain medications.  Wound care: 2 days after surgery (01/30), you may shower/get incision wet with soapy water and pat dry (do not rub incisions), but no baths or submerging incision underwater until follow-up.   Medications: Resume all home medications. For mild to moderate pain: acetaminophen (Tylenol) or ibuprofen/naproxen (if no kidney disease). Combining Tylenol with alcohol can substantially increase your risk of causing liver disease. Narcotic pain medications, if prescribed, can be used for severe pain, though may cause nausea, constipation, and drowsiness. Do not combine Tylenol and Percocet (or similar) within a 6 hour period as Percocet (and similar) contain(s) Tylenol. If you do not need the narcotic pain medication, you do not need to fill the prescription.  Call office [phone] / [phone]) at any time if any questions, worsening pain, fevers/chills, bleeding, drainage from incision site, or other concerns.

## 2018-02-24 LAB — SURGICAL PATHOLOGY

## 2018-02-28 ENCOUNTER — Telehealth: Payer: Self-pay | Admitting: *Deleted

## 2018-02-28 NOTE — Telephone Encounter (Signed)
Patient notified that she will not be able to do any heavy lifting greater than 15-20 lbs for 2 weeks after surgery followed by no lifting over 40 lbs for another 2 weeks after that.   Dr. Earlene Plater did not specify when she could return to work.   Patient would like a definite answer on this.   Again follow up appointment scheduled for 03-10-18 with Dr. Earlene Plater.  I did not see that we had received FMLA paperwork yet which should reflect the above restrictions.

## 2018-02-28 NOTE — Telephone Encounter (Signed)
Patient called the office wanting to know when she can return to work.   The patient had surgery with Dr. Earlene Plater on 02-22-18.   Patient states she works for [employer] and does a lot of heavy lifting. Patient states she could return to work with a weight restriction but there would be nothing for her to do. Patient states that she is still not feeling the bast and doesn't think another week out of work will be enough. 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   The patient was provided with our fax number so her employer can fax over [employer] [facility] [employer] paperwork.   She is aware of post op visit scheduled for 03-10-18.  Patient wishes to get a call back on at [phone] when Dr. Earlene Plater lets [place] know about restrictions and when she can return to work.   Note routed to Dr. Earlene Plater.

## 2018-03-01 NOTE — Telephone Encounter (Signed)
Spoke with patient and she is requesting to return to work 6 weeks after Laparoscopic Cholecystectomy  02/22/2018 which would return to work 04/12/2018 due to heavy lifting. 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

## 2018-03-09 ENCOUNTER — Telehealth: Payer: Self-pay | Admitting: *Deleted

## 2018-03-09 NOTE — Telephone Encounter (Signed)
Need to ask her return to work date for [employer] papers?

## 2018-03-09 NOTE — Telephone Encounter (Signed)
Patient has returned call. Patient had surgery on 02/22/18 with Dr Davis-Laparoscopic cholecystectomy. She stated that she did not feel that she could make the decision of her return date until she is seen by Dr Earlene Plater at her scheduled appointment tomorrow 03/10/18. If these need to be completed today, please note on forms that return date will be decided at office visit and she is incapacitated at this time until her scheduled post op appointment.

## 2018-03-10 ENCOUNTER — Ambulatory Visit (INDEPENDENT_AMBULATORY_CARE_PROVIDER_SITE_OTHER): Payer: Commercial Managed Care - PPO | Admitting: Surgery

## 2018-03-10 ENCOUNTER — Encounter: Payer: Self-pay | Admitting: Surgery

## 2018-03-10 ENCOUNTER — Other Ambulatory Visit: Payer: Self-pay

## 2018-03-10 VITALS — BP 116/82 | HR 86 | Temp 97.7°F | Resp 16 | Ht 63.0 in | Wt 157.8 lb

## 2018-03-10 DIAGNOSIS — K8012 Calculus of gallbladder with acute and chronic cholecystitis without obstruction: Secondary | ICD-10-CM

## 2018-03-10 DIAGNOSIS — Z4889 Encounter for other specified surgical aftercare: Secondary | ICD-10-CM

## 2018-03-10 NOTE — Progress Notes (Signed)
Surgical Clinic Progress/Follow-up Note   HPI:  28 y.o. Female presents to clinic for post-op follow-up 16 Days s/p laparoscopic cholecystectomy Maria Hansen, 02/22/2018) for acute cholecystitis. Patient reports complete resolution of pre-operative pain and has been tolerating regular diet with +flatus and overall normal BM's except looser with fatty foods she has tried to limit, denies N/V, fever/chills, CP, or SOB.  Review of Systems:  Constitutional: denies fever/chills  Respiratory: denies shortness of breath, wheezing  Cardiovascular: denies chest pain, palpitations  Gastrointestinal: abdominal pain, N/V, and bowel function as per interval history Skin: Denies any other rashes or skin discolorations except post-surgical wounds as per interval history  Vital Signs:  BP 116/82   Pulse 86   Temp 97.7 F (36.5 C) (Temporal)   Resp 16   Ht 5\' 3"  (1.6 m)   Wt 157 lb 12.8 oz (71.6 kg)   SpO2 97%   BMI 27.95 kg/m    Physical Exam:  Constitutional:  -- Normal body habitus  -- Awake, alert, and oriented x3  Pulmonary:  -- No crackles -- Equal breath sounds bilaterally -- Breathing non-labored at rest Cardiovascular:  -- S1, S2 present  -- No pericardial rubs  Gastrointestinal:  -- Soft and non-distended, non-tender to palpation, no guarding/rebound tenderness -- Post-surgical incisions all well-approximated without any peri-incisional erythema or drainage -- No abdominal masses appreciated, pulsatile or otherwise  Musculoskeletal / Integumentary:  -- Wounds or skin discoloration: None appreciated except post-surgical incisions as described above (GI) -- Extremities: B/L UE and LE FROM, hands and feet warm, no edema   Imaging: No new pertinent imaging available for review  Assessment:  28 y.o. yo Female with a problem list including...  Patient Active Problem List   Diagnosis Date Noted  . 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 Trigger middle finger of right hand 12/09/2017  . Calculus of gallbladder without cholecystitis without obstruction 10/12/2017  . Gastroesophageal reflux disease 10/12/2017  . 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  Plan:              - advance diet as tolerated              - okay to submerge incisions under water (baths, swimming) prn             - gradually resume all activities without restrictions over next 2 weeks             - apply sunblock particularly to incisions with sun exposure to reduce pigmentation of scars             - return to clinic as needed, instructed to call office if any questions or concerns  All of the above recommendations were discussed with the patient, and all of patient's questions were answered to her expressed satisfaction.  -- Maria Hansen Maria Plater, MD, RPVI [place]: [facility] General [facility] - Partnering for exceptional care. Office: [phone]

## 2018-03-10 NOTE — Patient Instructions (Addendum)
GENERAL POST-OPERATIVE PATIENT INSTRUCTIONS   WOUND CARE INSTRUCTIONS:  Keep a dry clean dressing on the wound if there is drainage. The initial bandage may be removed after 24 hours.  Once the wound has quit draining you may leave it open to air.  If clothing rubs against the wound or causes irritation and the wound is not draining you may cover it with a dry dressing during the daytime.  Try to keep the wound dry and avoid ointments on the wound unless directed to do so.  If the wound becomes bright red and painful or starts to drain infected material that is not clear, please contact your physician immediately.  If the wound is mildly pink and has a thick firm ridge underneath it, this is normal, and is referred to as a healing ridge.  This will resolve over the next 4-6 weeks.  BATHING: You may shower if you have been informed of this by your surgeon. However, Please do not submerge in a tub, hot tub, or pool until incisions are completely sealed or have been told by your surgeon that you may do so.  DIET:  You may eat any foods that you can tolerate.  It is a good idea to eat a high fiber diet and take in plenty of fluids to prevent constipation.  If you do become constipated you may want to take a mild laxative or take ducolax tablets on a daily basis until your bowel habits are regular.  Constipation can be very uncomfortable, along with straining, after recent surgery.  ACTIVITY:  You are encouraged to cough and deep breath or use your incentive spirometer if you were given one, every 15-30 minutes when awake.  This will help prevent respiratory complications and low grade fevers post-operatively if you had a general anesthetic.  You may want to hug a pillow when coughing and sneezing to add additional support to the surgical area, if you had abdominal or chest surgery, which will decrease pain during these times.  You are encouraged to walk and engage in light activity for the next two weeks.  You  should not lift more than 40 pounds, until 03/24/2018 as it could put you at increased risk for complications.  Twenty pounds is roughly equivalent to a plastic bag of groceries. At that time- Listen to your body when lifting, if you have pain when lifting, stop and then try again in a few days. Soreness after doing exercises or activities of daily living is normal as you get back in to your normal routine.  MEDICATIONS:  Try to take narcotic medications and anti-inflammatory medications, such as tylenol, ibuprofen, naprosyn, etc., with food.  This will minimize stomach upset from the medication.  Should you develop nausea and vomiting from the pain medication, or develop a rash, please discontinue the medication and contact your physician.  You should not drive, make important decisions, or operate machinery when taking narcotic pain medication.  SUNBLOCK Use sun block to incision area over the next year if this area will be exposed to sun. This helps decrease scarring and will allow you avoid a permanent darkened area over your incision.  QUESTIONS:  Please feel free to call our office if you have any questions, and we will be glad to assist you. [phone]    

## 2018-03-15 ENCOUNTER — Telehealth: Payer: Self-pay

## 2018-03-15 NOTE — Telephone Encounter (Signed)
Placed return to work note at front desk, patient will pick up 03/19/2018.

## 2018-03-22 ENCOUNTER — Encounter: Payer: Self-pay | Admitting: Certified Nurse Midwife

## 2018-03-28 NOTE — Progress Notes (Unsigned)
Patients fmla paper work and disability letter faxed over and completed, a copy has been placed in the scan folder and in the completed folder.

## 2018-04-07 ENCOUNTER — Ambulatory Visit: Payer: Self-pay | Admitting: Family Medicine

## 2018-04-12 NOTE — Progress Notes (Signed)
Patients job is requesting that the patient have a form completed for the patient to have additional restroom breaks the form has been completed and patient has been notified.

## 2018-05-04 ENCOUNTER — Ambulatory Visit (INDEPENDENT_AMBULATORY_CARE_PROVIDER_SITE_OTHER): Payer: Commercial Managed Care - PPO | Admitting: Gastroenterology

## 2018-05-04 DIAGNOSIS — Z5329 Procedure and treatment not carried out because of patient's decision for other reasons: Secondary | ICD-10-CM

## 2018-06-29 ENCOUNTER — Emergency Department: Payer: Commercial Managed Care - PPO

## 2018-06-29 ENCOUNTER — Emergency Department
Admission: EM | Admit: 2018-06-29 | Discharge: 2018-06-29 | Disposition: A | Discharge status: Home / Self Care | Payer: Commercial Managed Care - PPO | Attending: Emergency Medicine | Admitting: Emergency Medicine

## 2018-06-29 ENCOUNTER — Encounter: Payer: Self-pay | Admitting: Emergency Medicine

## 2018-06-29 ENCOUNTER — Other Ambulatory Visit: Payer: Self-pay

## 2018-06-29 DIAGNOSIS — F17210 Nicotine dependence, cigarettes, uncomplicated: Secondary | ICD-10-CM | POA: Insufficient documentation

## 2018-06-29 DIAGNOSIS — Z9049 Acquired absence of other specified parts of digestive tract: Secondary | ICD-10-CM | POA: Insufficient documentation

## 2018-06-29 DIAGNOSIS — Z332 Encounter for elective termination of pregnancy: Secondary | ICD-10-CM | POA: Diagnosis not present

## 2018-06-29 DIAGNOSIS — N939 Abnormal uterine and vaginal bleeding, unspecified: Secondary | ICD-10-CM | POA: Diagnosis present

## 2018-06-29 DIAGNOSIS — N898 Other specified noninflammatory disorders of vagina: Secondary | ICD-10-CM | POA: Diagnosis not present

## 2018-06-29 LAB — BASIC METABOLIC PANEL
Anion gap: 6 (ref 5–15)
BUN: 18 mg/dL (ref 6–20)
CO2: 22 mmol/L (ref 22–32)
Calcium: 8.8 mg/dL — ABNORMAL LOW (ref 8.9–10.3)
Chloride: 109 mmol/L (ref 98–111)
Creatinine, Ser: 0.84 mg/dL (ref 0.44–1.00)
GFR calc Af Amer: >60 mL/min (ref >60)
GFR calc non Af Amer: >60 mL/min (ref >60)
Glucose, Bld: 101 mg/dL — ABNORMAL HIGH (ref 70–99)
Potassium: 3.6 mmol/L (ref 3.5–5.1)
Sodium: 137 mmol/L (ref 135–145)

## 2018-06-29 LAB — CBC
HCT: 36.8 % (ref 36.0–46.0)
Hemoglobin: 12.3 g/dL (ref 12.0–15.0)
MCH: 28.5 pg (ref 26.0–34.0)
MCHC: 33.4 g/dL (ref 30.0–36.0)
MCV: 85.2 fL (ref 80.0–100.0)
Platelets: 332 10*3/uL (ref 150–400)
RBC: 4.32 MIL/uL (ref 3.87–5.11)
RDW: 13.3 % (ref 11.5–15.5)
WBC: 9.7 10*3/uL (ref 4.0–10.5)
nRBC: 0 % (ref 0.0–0.2)

## 2018-06-29 LAB — HCG, QUANTITATIVE, PREGNANCY: hCG, Beta Chain, Quant, S: 1124 m[IU]/mL — ABNORMAL HIGH (ref <5)

## 2018-06-29 MED ORDER — METHYLERGONOVINE MALEATE 0.2 MG PO TABS
0.2000 mg | ORAL_TABLET | Freq: Once | ORAL | Status: AC
  Administered 2018-06-29: 0.2 mg via ORAL
  Filled 2018-06-29: qty 1

## 2018-06-29 MED ORDER — METHYLERGONOVINE MALEATE 0.2 MG PO TABS: 0.2000 mg | ORAL_TABLET | Freq: Four times a day (QID) | ORAL | 0 refills | Status: AC

## 2018-06-29 NOTE — ED Triage Notes (Signed)
Pt presents to ED c/o heavy vaginal bleeding with clots x2-3 days. Pt reports having abortion 06/11/18 and nexplanon inserted 1 wk ago. Pt called OB office and was advised to come to ED.

## 2018-06-29 NOTE — Discharge Instructions (Signed)
Turn to the emergency room for any new or worrisome symptoms including bleeding more than a pad an hour, lightheadedness, severe pain or other concerns.  Expect your vaginal bleeding to continue hopefully though taper off.  Please call your OB/GYN's they will follow-up with you tomorrow or the next day.

## 2018-06-29 NOTE — ED Provider Notes (Signed)
[facility] Emergency Department Provider Note  ____________________________________________   I have reviewed the triage vital signs and the nursing notes. Where available I have reviewed prior notes and, if possible and indicated, outside hospital notes.    HISTORY  Chief Complaint Vaginal Bleeding    HPI Maria Hansen is a 28 y.o. female  With a history of recurrent pregnancy loss, patient who is G9, P1, has a history of multiple miscarriages, 1 live birth, and 3 TAB's.  Most recent menstrual period was mid March.  Had a procedure with methimazole as an outpatient for termination.  Did have some bleeding immediately thereafter.  Went to [place] [facility] and had a Norplant on placed a week ago then began having vaginal bleeding last night and today.  Had some clots.  No products of conception.  Mild cramping.  Not lightheaded no other symptoms a only makes it better nothing this is worse no other alleviating or aggravating factors.  And has known b positive blood which I can see on multiple prior blood draws    Past Medical History:  Diagnosis Date  . Acid reflux disease   . Calculus of gallbladder with acute and chronic cholecystitis without obstruction 02/22/2018    Patient Active Problem List   Diagnosis Date Noted  . Trigger middle finger of right hand 12/09/2017  . Gastroesophageal reflux disease 10/12/2017  . 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 CHOLECYSTECTOMY N/A 02/22/2018   Procedure: LAPAROSCOPIC CHOLECYSTECTOMY;  Surgeon: Ancil Linsey, MD;  Location: [facility] ORS;  Service: General;  Laterality: N/A;  . DILATION AND CURETTAGE OF UTERUS  2013, 2011,  . KNEE SURGERY Left     Prior to Admission medications   Not on File    Allergies Patient has no known allergies.  Family History  Problem Relation Age of Onset  . Gallbladder disease Mother   .  Hypertension Father     Social History Social History   Tobacco Use  . Smoking status: Current Every Day Smoker    Packs/day: 0.25    Types: Cigarettes  . Smokeless tobacco: Never Used  Substance Use Topics  . Alcohol use: No  . Drug use: No    Review of Systems Constitutional: No fever/chills Eyes: No visual changes. ENT: No sore throat. No stiff neck no neck pain Cardiovascular: Denies chest pain. Respiratory: Denies shortness of breath. Gastrointestinal:   no vomiting.  No diarrhea.  No constipation. Genitourinary: Negative for dysuria. Musculoskeletal: Negative lower extremity swelling Skin: Negative for rash. Neurological: Negative for severe headaches, focal weakness or numbness.   ____________________________________________   PHYSICAL EXAM:  VITAL SIGNS: ED Triage Vitals  Enc Vitals Group     BP 06/29/18 1716 116/79     Pulse Rate 06/29/18 1716 78     Resp 06/29/18 1716 18     Temp 06/29/18 1716 98.6 F (37 C)     Temp Source 06/29/18 1716 Oral     SpO2 06/29/18 1716 98 %     Weight 06/29/18 1717 161 lb (73 kg)     Height 06/29/18 1717 5\' 3"  (1.6 m)     Head Circumference --      Peak Flow --      Pain Score 06/29/18 1717 2     Pain Loc --      Pain Edu? --      Excl. in GC? --  Constitutional: Alert and oriented. Well appearing and in no acute distress. Eyes: Conjunctivae are normal Head: Atraumatic HEENT: No congestion/rhinnorhea. Mucous membranes are moist.  Oropharynx non-erythematous Neck:   Nontender with no meningismus, no masses, no stridor Cardiovascular: Normal rate, regular rhythm. Grossly normal heart sounds.  Good peripheral circulation. Respiratory: Normal respiratory effort.  No retractions. Lungs CTAB. Abdominal: Soft and nontender. No distention. No guarding no rebound Back:  There is no focal tenderness or step off.  there is no midline tenderness there are no lesions noted. there is no CVA tenderness Pelvic exam: Female  nurse chaperone present, no external lesions noted, physiologic vaginal discharge noted with no purulent discharge, no cervical motion tenderness, no adnexal tenderness or mass, there is no significant uterine tenderness or mass.  Moderate vaginal bleeding Musculoskeletal: No lower extremity tenderness, no upper extremity tenderness. No joint effusions, no DVT signs strong distal pulses no edema Neurologic:  Normal speech and language. No gross focal neurologic deficits are appreciated.  Skin:  Skin is warm, dry and intact. No rash noted. Psychiatric: Mood and affect are normal. Speech and behavior are normal.  ____________________________________________   LABS (all labs ordered are listed, but only abnormal results are displayed)  Labs Reviewed  BASIC METABOLIC PANEL - Abnormal; Notable for the following components:      Result Value   Glucose, Bld 101 (*)    Calcium 8.8 (*)    All other components within normal limits  HCG, QUANTITATIVE, PREGNANCY - Abnormal; Notable for the following components:   hCG, Beta Chain, Quant, S 1,124 (*)    All other components within normal limits  CBC  POC URINE PREG, ED    Pertinent labs  results that were available during my care of the patient were reviewed by me and considered in my medical decision making (see chart for details). ____________________________________________  EKG  I personally interpreted any EKGs ordered by me or triage  ____________________________________________  RADIOLOGY  Pertinent labs & imaging results that were available during my care of the patient were reviewed by me and considered in my medical decision making (see chart for details). If possible, patient and/or family made aware of any abnormal findings.  [place] Ob Comp Less 14 Wks  Result Date: 06/29/2018 CLINICAL DATA:  Vaginal bleeding status post abortion. EXAM: OBSTETRIC <14 WK US AND TRANSVAGINAL OB US TECHNIQUE: Both transabdominal and transvaginal  ultrasound examinations were performed for complete evaluation of the gestation as well as the maternal uterus, adnexal regions, and pelvic cul-de-sac. Transvaginal technique was performed to assess early pregnancy. COMPARISON:  02/21/2018 FINDINGS: Intrauterine gestational sac: None Yolk sac:  Not Visualized. Embryo:  Not Visualized. Cardiac Activity: Not Visualized. Subchorionic hemorrhage:  None visualized. Maternal uterus/adnexae: The right ovary is unremarkable. The left ovary is not visualized. There is some focal thickening of the endometrium measuring up to approximately 11 mm. There are few surrounding cystic areas. There is a trace amount of free fluid in the pelvis. IMPRESSION: 1. No IUP identified. 2. Focal thickening of the endometrium measuring up to approximately 11 mm with small surrounding cystic areas is nonspecific. Differential considerations include retained products of conception. Follow-up is recommended to confirm resolution of these findings. 3. Left ovary was not well visualized on this exam. 4. Trace amount of free fluid in the pelvis. Electronically Signed   By: Katherine Mantlehristopher  Green M.D.   On: 06/29/2018 20:50   [place] Ob Transvaginal  Result Date: 06/29/2018 CLINICAL DATA:  Vaginal bleeding status post abortion. EXAM:  OBSTETRIC <14 WK [place] AND TRANSVAGINAL OB US TECHNIQUE: Both transabdominal and transvaginal ultrasound examinations were performed for complete evaluation of the gestation as well as the maternal uterus, adnexal regions, and pelvic cul-de-sac. Transvaginal technique was performed to assess early pregnancy. COMPARISON:  02/21/2018 FINDINGS: Intrauterine gestational sac: None Yolk sac:  Not Visualized. Embryo:  Not Visualized. Cardiac Activity: Not Visualized. Subchorionic hemorrhage:  None visualized. Maternal uterus/adnexae: The right ovary is unremarkable. The left ovary is not visualized. There is some focal thickening of the endometrium measuring up to approximately 11 mm.  There are few surrounding cystic areas. There is a trace amount of free fluid in the pelvis. IMPRESSION: 1. No IUP identified. 2. Focal thickening of the endometrium measuring up to approximately 11 mm with small surrounding cystic areas is nonspecific. Differential considerations include retained products of conception. Follow-up is recommended to confirm resolution of these findings. 3. Left ovary was not well visualized on this exam. 4. Trace amount of free fluid in the pelvis. Electronically Signed   By: Katherine Mantle M.D.   On: 06/29/2018 20:50   ____________________________________________    PROCEDURES  Procedure(s) performed: None  Procedures  Critical Care performed: None  ____________________________________________   INITIAL IMPRESSION / ASSESSMENT AND PLAN / ED COURSE  Pertinent labs & imaging results that were available during my care of the patient were reviewed by me and considered in my medical decision making (see chart for details).  Discussed with Dr. Feliberto Gottron, who is from her OB/GYN practice.  We discussed the patient's past medical history, her physical findings, her vital signs, her blood work, and her ultrasound.  He recommends that the patient received Methergine 0.2 mg p.o. 4 times daily x2 days and he will see her in the office tomorrow.  Patient is hemodynamically stable at this time.  Extensive return precautions follow-up given and understood.  Patient is completing her TAB it appears.  No evidence of infection.    ____________________________________________   FINAL CLINICAL IMPRESSION(S) / ED DIAGNOSES  Final diagnoses:  None      This chart was dictated using voice recognition software.  Despite best efforts to proofread,  errors can occur which can change meaning.      Jeanmarie Plant, MD 06/29/18 2150

## 2018-07-05 ENCOUNTER — Other Ambulatory Visit: Payer: Commercial Managed Care - PPO

## 2018-07-05 ENCOUNTER — Other Ambulatory Visit
Admission: RE | Admit: 2018-07-05 | Discharge: 2018-07-05 | Disposition: A | Discharge status: Home / Self Care | Payer: Commercial Managed Care - PPO | Source: Ambulatory Visit | Attending: Obstetrics and Gynecology | Admitting: Obstetrics and Gynecology

## 2018-07-05 ENCOUNTER — Other Ambulatory Visit: Payer: Self-pay

## 2018-07-05 DIAGNOSIS — Z01812 Encounter for preprocedural laboratory examination: Secondary | ICD-10-CM | POA: Insufficient documentation

## 2018-07-05 DIAGNOSIS — Z1159 Encounter for screening for other viral diseases: Secondary | ICD-10-CM | POA: Insufficient documentation

## 2018-07-05 LAB — SARS CORONAVIRUS 2 BY RT PCR (HOSPITAL ORDER, PERFORMED IN ~~LOC~~ HOSPITAL LAB): SARS Coronavirus 2: NEGATIVE

## 2018-07-05 NOTE — H&P (Signed)
Maria Hansen is a 28 y.o. female here for Rf by Dr Donnetta SimpersJames McShane-ED follow up vaginal bleeding consultation for continued bleeding  Post RU486 and misoprostol 5/16/ . Pt states she passed tissue and thought it was a completed process, but she continued bleeding . Marland Kitchen. She had a Nexplanon placed by MH on 06/22/2018. She went to ED on 06/29/18 and u/s showed 11 echogenicity  Quant= 1124 ( 06/29/2018) today = 288 . BT B+ No specific pain   U/s today : Uterus anteverted  Complex thickened endometrium with echogenic material=13.684mm; cystic area within=0.49cm  Free fluid seen in PCDS  B/L ovaries appear wnl  Past Medical History:  has a past medical history of Gallstones.  Past Surgical History:  has a past surgical history that includes Knee arthroscopy (Left, 2013) and Cholecystectomy (01/2018). Family History: family history includes High blood pressure (Hypertension) in her mother; Hyperlipidemia (Elevated cholesterol) in her mother; Osteoarthritis in her father, maternal grandfather, maternal grandmother, mother, paternal grandfather, paternal grandmother, and sister. Social History:  reports that she has been smoking cigarettes. She has been smoking about 0.20 packs per day. She has never used smokeless tobacco. She reports current alcohol use. She reports previous drug use. OB/GYN History:          OB History    Gravida  8   Para  1   Term  1   Preterm      AB  7   Living  1     SAB  5   TAB  2   Ectopic      Molar      Multiple      Live Births  1          Allergies: has No Known Allergies. Medications:  Current Outpatient Medications:  .  multivitamin capsule, Take 1 capsule by mouth once daily, Disp: , Rfl:   Review of Systems: General:                      No fatigue or weight loss Eyes:                           No vision changes Ears:                            No hearing difficulty Respiratory:                No cough or shortness of  breath Pulmonary:                  No asthma or shortness of breath Cardiovascular:           No chest pain, palpitations, dyspnea on exertion Gastrointestinal:          No abdominal bloating, chronic diarrhea, constipations, masses, pain or hematochezia Genitourinary:             No hematuria, dysuria, abnormal vaginal discharge, pelvic pain, Menometrorrhagia, + bleeding  Lymphatic:                   No swollen lymph nodes Musculoskeletal:         No muscle weakness Neurologic:                  No extremity weakness, syncope, seizure disorder Psychiatric:  No history of depression, delusions or suicidal/homicidal ideation    Exam:      Vitals:   07/05/18 0926  BP: 108/66  Pulse: 69    Body mass index is 28.17 kg/m.  WDWN Hispanic  female in NAD   Lungs: CTA  CV : RRR without murmur   Neck:  no thyromegaly Abdomen: soft , no mass, normal active bowel sounds,  non-tender, no rebound tenderness Pelvic: tanner stage 5 ,  External genitalia: vulva /labia no lesions Urethra: no prolapse Vagina: 1 cc dark blood  Cervix: no lesions, no cervical motion tenderness   Uterus: normal size shape and contour, non-tender Adnexa: no mass,  non-tender   Rectovaginal:  Impression:   The primary encounter diagnosis was Incomplete abortion. A diagnosis of Retained fetal tissue was also pertinent to this visit.  Residual tissue in the endometrium   Plan:   I reviewed the u/s findings with her  Spoke to the patient regarding options , expectant management , cytotec vaginal or Suction D+C After consideration she has elected for the latter .  60 minute appt with 35 minutes in direct counseling and examination and coordination of pt care   Benefits and risks to surgery: The proposed benefit of the surgery has been discussed with the patient. The possible risks include, but are not limited to: organ injury to the bowel , bladder, ureters, and major blood vessels  and nerves. There is a possibility of additional surgeries resulting from these injuries. There is also the risk of blood transfusion and the need to receive blood products during or after the procedure which may rarely lead to HIV or Hepatitis C infection. There is a risk of developing a deep venous thrombosis or a pulmonary embolism . There is the possibility of wound infection and also anesthetic complications, even the rare possibility of death. The patient understands these risks and wishes to proceed. All questions have been answered and the consent has been signed.         Orders Placed This Encounter  Procedures  . US OB transvaginal    [facility]    Standing Status:   Future    Number of Occurrences:   1    Standing Expiration Date:   07/06/2019  . CBC w/auto Differential (5 Part)  . Beta HCG, Quantitative, Blood

## 2018-07-06 ENCOUNTER — Other Ambulatory Visit: Admission: RE | Admit: 2018-07-06 | Payer: Commercial Managed Care - PPO | Source: Ambulatory Visit

## 2018-07-06 ENCOUNTER — Inpatient Hospital Stay: Admission: RE | Admit: 2018-07-06 | Payer: Commercial Managed Care - PPO | Source: Ambulatory Visit

## 2018-07-07 ENCOUNTER — Ambulatory Visit: Payer: Commercial Managed Care - PPO | Admitting: Anesthesiology

## 2018-07-07 ENCOUNTER — Ambulatory Visit
Admission: RE | Admit: 2018-07-07 | Discharge: 2018-07-07 | Disposition: A | Discharge status: Home / Self Care | Payer: Commercial Managed Care - PPO | Attending: Obstetrics and Gynecology | Admitting: Obstetrics and Gynecology

## 2018-07-07 ENCOUNTER — Other Ambulatory Visit: Payer: Self-pay

## 2018-07-07 ENCOUNTER — Encounter: Payer: Self-pay | Admitting: *Deleted

## 2018-07-07 ENCOUNTER — Encounter
Admission: RE | Disposition: A | Discharge status: Home / Self Care | Payer: Self-pay | Source: Home / Self Care | Attending: Obstetrics and Gynecology

## 2018-07-07 DIAGNOSIS — Z8249 Family history of ischemic heart disease and other diseases of the circulatory system: Secondary | ICD-10-CM | POA: Insufficient documentation

## 2018-07-07 DIAGNOSIS — O034 Incomplete spontaneous abortion without complication: Secondary | ICD-10-CM | POA: Diagnosis present

## 2018-07-07 DIAGNOSIS — Z8261 Family history of arthritis: Secondary | ICD-10-CM | POA: Insufficient documentation

## 2018-07-07 DIAGNOSIS — O071 Delayed or excessive hemorrhage following failed attempted termination of pregnancy: Secondary | ICD-10-CM | POA: Insufficient documentation

## 2018-07-07 DIAGNOSIS — Z9049 Acquired absence of other specified parts of digestive tract: Secondary | ICD-10-CM | POA: Diagnosis not present

## 2018-07-07 DIAGNOSIS — F17210 Nicotine dependence, cigarettes, uncomplicated: Secondary | ICD-10-CM | POA: Diagnosis not present

## 2018-07-07 HISTORY — PX: DILATION AND EVACUATION: SHX1459

## 2018-07-07 LAB — BASIC METABOLIC PANEL
Anion gap: 5 (ref 5–15)
BUN: 12 mg/dL (ref 6–20)
CO2: 22 mmol/L (ref 22–32)
Calcium: 8.2 mg/dL — ABNORMAL LOW (ref 8.9–10.3)
Chloride: 112 mmol/L — ABNORMAL HIGH (ref 98–111)
Creatinine, Ser: 0.64 mg/dL (ref 0.44–1.00)
GFR calc Af Amer: >60 mL/min (ref >60)
GFR calc non Af Amer: >60 mL/min (ref >60)
Glucose, Bld: 102 mg/dL — ABNORMAL HIGH (ref 70–99)
Potassium: 3.7 mmol/L (ref 3.5–5.1)
Sodium: 139 mmol/L (ref 135–145)

## 2018-07-07 LAB — TYPE AND SCREEN
ABO/RH(D): B POS
Antibody Screen: NEGATIVE

## 2018-07-07 LAB — ABO/RH: ABO/RH(D): B POS

## 2018-07-07 SURGERY — DILATION AND EVACUATION, UTERUS
Anesthesia: General | Site: Cervix

## 2018-07-07 MED ORDER — GABAPENTIN 300 MG PO CAPS
300.0000 mg | ORAL_CAPSULE | ORAL | Status: AC
  Administered 2018-07-07: 300 mg via ORAL

## 2018-07-07 MED ORDER — FENTANYL CITRATE (PF) 100 MCG/2ML IJ SOLN
INTRAMUSCULAR | Status: DC | PRN
  Administered 2018-07-07: 100 ug via INTRAVENOUS
  Administered 2018-07-07 (×2): 50 ug via INTRAVENOUS

## 2018-07-07 MED ORDER — METHYLERGONOVINE MALEATE 0.2 MG/ML IJ SOLN
INTRAMUSCULAR | Status: DC | PRN
  Administered 2018-07-07: 0.2 mg via INTRAMUSCULAR

## 2018-07-07 MED ORDER — PROPOFOL 10 MG/ML IV BOLUS
INTRAVENOUS | Status: AC
  Filled 2018-07-07: qty 80

## 2018-07-07 MED ORDER — FENTANYL CITRATE (PF) 100 MCG/2ML IJ SOLN
INTRAMUSCULAR | Status: AC
  Filled 2018-07-07: qty 2

## 2018-07-07 MED ORDER — ONDANSETRON HCL 4 MG/2ML IJ SOLN: 4.0000 mg | Freq: Once | INTRAMUSCULAR | Status: DC | PRN

## 2018-07-07 MED ORDER — CEFAZOLIN SODIUM-DEXTROSE 2-4 GM/100ML-% IV SOLN
2.0000 g | Freq: Once | INTRAVENOUS | Status: AC
  Administered 2018-07-07: 2 g via INTRAVENOUS

## 2018-07-07 MED ORDER — KETOROLAC TROMETHAMINE 30 MG/ML IJ SOLN
INTRAMUSCULAR | Status: DC | PRN
  Administered 2018-07-07: 30 mg via INTRAVENOUS

## 2018-07-07 MED ORDER — METHYLERGONOVINE MALEATE 0.2 MG/ML IJ SOLN
INTRAMUSCULAR | Status: AC
  Filled 2018-07-07: qty 1

## 2018-07-07 MED ORDER — ACETAMINOPHEN 500 MG PO TABS
1000.0000 mg | ORAL_TABLET | ORAL | Status: AC
  Administered 2018-07-07: 1000 mg via ORAL

## 2018-07-07 MED ORDER — FENTANYL CITRATE (PF) 100 MCG/2ML IJ SOLN
25.0000 ug | INTRAMUSCULAR | Status: DC | PRN
  Administered 2018-07-07: 25 ug via INTRAVENOUS

## 2018-07-07 MED ORDER — GABAPENTIN 300 MG PO CAPS
ORAL_CAPSULE | ORAL | Status: AC
  Administered 2018-07-07: 300 mg via ORAL
  Filled 2018-07-07: qty 1

## 2018-07-07 MED ORDER — FAMOTIDINE 20 MG PO TABS
20.0000 mg | ORAL_TABLET | Freq: Once | ORAL | Status: AC
  Administered 2018-07-07: 20 mg via ORAL

## 2018-07-07 MED ORDER — ACETAMINOPHEN 500 MG PO TABS
ORAL_TABLET | ORAL | Status: AC
  Administered 2018-07-07: 1000 mg via ORAL
  Filled 2018-07-07: qty 2

## 2018-07-07 MED ORDER — CEFAZOLIN SODIUM-DEXTROSE 2-4 GM/100ML-% IV SOLN
INTRAVENOUS | Status: AC
  Filled 2018-07-07: qty 100

## 2018-07-07 MED ORDER — LACTATED RINGERS IV SOLN
INTRAVENOUS | Status: DC
  Administered 2018-07-07: 08:00:00 via INTRAVENOUS

## 2018-07-07 MED ORDER — ONDANSETRON HCL 4 MG/2ML IJ SOLN
INTRAMUSCULAR | Status: DC | PRN
  Administered 2018-07-07: 4 mg via INTRAVENOUS

## 2018-07-07 MED ORDER — MIDAZOLAM HCL 2 MG/2ML IJ SOLN
INTRAMUSCULAR | Status: DC | PRN
  Administered 2018-07-07: 2 mg via INTRAVENOUS

## 2018-07-07 MED ORDER — MIDAZOLAM HCL 2 MG/2ML IJ SOLN
INTRAMUSCULAR | Status: AC
  Filled 2018-07-07: qty 2

## 2018-07-07 MED ORDER — DEXAMETHASONE SODIUM PHOSPHATE 10 MG/ML IJ SOLN
INTRAMUSCULAR | Status: DC | PRN
  Administered 2018-07-07: 10 mg via INTRAVENOUS

## 2018-07-07 MED ORDER — LACTATED RINGERS IV SOLN: INTRAVENOUS | Status: DC

## 2018-07-07 MED ORDER — LIDOCAINE HCL (CARDIAC) PF 100 MG/5ML IV SOSY
PREFILLED_SYRINGE | INTRAVENOUS | Status: DC | PRN
  Administered 2018-07-07: 100 mg via INTRAVENOUS

## 2018-07-07 MED ORDER — FAMOTIDINE 20 MG PO TABS
ORAL_TABLET | ORAL | Status: AC
  Administered 2018-07-07: 20 mg via ORAL
  Filled 2018-07-07: qty 1

## 2018-07-07 MED ORDER — SILVER NITRATE-POT NITRATE 75-25 % EX MISC
CUTANEOUS | Status: AC
  Filled 2018-07-07: qty 4

## 2018-07-07 MED ORDER — PROPOFOL 10 MG/ML IV BOLUS
INTRAVENOUS | Status: DC | PRN
  Administered 2018-07-07: 200 mg via INTRAVENOUS

## 2018-07-07 SURGICAL SUPPLY — 21 items
CATH ROBINSON RED A/P 16FR (CATHETERS) ×3 IMPLANT
COVER WAND RF STERILE (DRAPES) ×3 IMPLANT
FILTER UTR ASPR SPEC (MISCELLANEOUS) ×1 IMPLANT
FLTR UTR ASPR SPEC (MISCELLANEOUS) ×3
GLOVE BIO SURGEON STRL SZ8 (GLOVE) ×7 IMPLANT
GOWN STRL REUS W/ TWL LRG LVL3 (GOWN DISPOSABLE) ×1 IMPLANT
GOWN STRL REUS W/ TWL XL LVL3 (GOWN DISPOSABLE) ×1 IMPLANT
GOWN STRL REUS W/TWL LRG LVL3 (GOWN DISPOSABLE) ×2
GOWN STRL REUS W/TWL XL LVL3 (GOWN DISPOSABLE) ×2
KIT TURNOVER CYSTO (KITS) ×3 IMPLANT
PACK DNC HYST (MISCELLANEOUS) ×3 IMPLANT
PAD OB MATERNITY 4.3X12.25 (PERSONAL CARE ITEMS) ×3 IMPLANT
PAD PREP 24X41 OB/GYN DISP (PERSONAL CARE ITEMS) ×3 IMPLANT
SET BERKELEY SUCTION TUBING (SUCTIONS) ×3 IMPLANT
TOWEL OR 17X26 4PK STRL BLUE (TOWEL DISPOSABLE) ×3 IMPLANT
VACURETTE 10 RIGID CVD (CANNULA) ×1 IMPLANT
VACURETTE 6 ASPIR F TIP BERK (CANNULA) ×1 IMPLANT
VACURETTE 7MM F TIP (CANNULA) ×2
VACURETTE 7MM F TIP STRL (CANNULA) ×1 IMPLANT
VACURETTE 8 RIGID CVD (CANNULA) ×1 IMPLANT
VACURETTE 8MM F TIP (MISCELLANEOUS) ×1 IMPLANT

## 2018-07-07 NOTE — Anesthesia Preprocedure Evaluation (Signed)
Anesthesia Evaluation  Patient identified by MRN, date of birth, ID band Patient awake    Reviewed: Allergy & Precautions, H&P , NPO status , Patient's Chart, lab work & pertinent test results, reviewed documented beta blocker date and time   Airway Mallampati: II  TM Distance: >3 FB Neck ROM: full    Dental  (+) Teeth Intact   Pulmonary neg pulmonary ROS, Current Smoker,    Pulmonary exam normal        Cardiovascular Exercise Tolerance: Good negative cardio ROS Normal cardiovascular exam Rate:Normal     Neuro/Psych negative neurological ROS  negative psych ROS   GI/Hepatic negative GI ROS, Neg liver ROS, GERD  ,  Endo/Other  negative endocrine ROS  Renal/GU negative Renal ROS  negative genitourinary   Musculoskeletal   Abdominal   Peds  Hematology negative hematology ROS (+)   Anesthesia Other Findings   Reproductive/Obstetrics negative OB ROS                             Anesthesia Physical Anesthesia Plan  ASA: II  Anesthesia Plan: General LMA   Post-op Pain Management:    Induction:   PONV Risk Score and Plan:   Airway Management Planned:   Additional Equipment:   Intra-op Plan:   Post-operative Plan:   Informed Consent: I have reviewed the patients History and Physical, chart, labs and discussed the procedure including the risks, benefits and alternatives for the proposed anesthesia with the patient or authorized representative who has indicated his/her understanding and acceptance.       Plan Discussed with: CRNA  Anesthesia Plan Comments:         Anesthesia Quick Evaluation

## 2018-07-07 NOTE — Anesthesia Procedure Notes (Signed)
Procedure Name: LMA Insertion Date/Time: 07/07/2018 9:27 AM Performed by: Leander Rams, CRNA Pre-anesthesia Checklist: Patient identified, Emergency Drugs available, Suction available, Patient being monitored and Timeout performed Patient Re-evaluated:Patient Re-evaluated prior to induction Oxygen Delivery Method: Circle system utilized Preoxygenation: Pre-oxygenation with 100% oxygen Induction Type: IV induction LMA: LMA inserted LMA Size: 3.5 Placement Confirmation: positive ETCO2,  CO2 detector and breath sounds checked- equal and bilateral Tube secured with: Tape Dental Injury: Teeth and Oropharynx as per pre-operative assessment

## 2018-07-07 NOTE — Discharge Instructions (Addendum)

## 2018-07-07 NOTE — Anesthesia Post-op Follow-up Note (Signed)
Anesthesia QCDR form completed.        

## 2018-07-07 NOTE — Op Note (Signed)
[name], Maria Hansen MEDICAL RECORD [id number] ACCOUNT [id number] DATE OF BIRTH:[birth year] FACILITY: [facility] LOCATION: [facility] PHYSICIAN:THOMAS Josefine Class, MD  OPERATIVE REPORT  DATE OF PROCEDURE:  07/07/2018  PREOPERATIVE DIAGNOSIS:  Incomplete abortion.  POSTOPERATIVE DIAGNOSIS:  Incomplete abortion.  PROCEDURE:  Suction dilation and curettage.  ANESTHESIA:  General endotracheal anesthesia.  SURGEON:  Laverta Baltimore, MD  INDICATIONS:  A 28 year old gravida 43, para 1.  The patient underwent termination on 06/11/2018.  The patient had continuous off and on bleeding since then.  Quantitative hCG 1124 followed by a repeat quantitative hCG of 288 on 07/05/2018.  DESCRIPTION OF PROCEDURE:  After adequate general endotracheal anesthesia, the patient was placed in dorsal supine position with the legs in the candy cane stirrups.  The patient's lower abdomen, perineum and vagina were prepped and draped in normal  sterile fashion.  Two grams of IV Ancef were administered prior to commencement of the case.  Timeout was performed.  Weighted speculum was placed in the posterior vaginal vault.  The bladder was drained with a red Robinson catheter yielding 50 mL of  clear urine.  The anterior cervix was grasped with a single tooth tenaculum and the cervix was dilated to #20 Hanks dilator without difficulty.  A #7 flexible suction curette was placed into the endometrial cavity and suction curettage performed with  bloody discharge and no discernible products of conception.  Sharp curettage was performed with good uterine cry followed by repeat suction curettage with no additional tissue or blood.  Good hemostasis was noted.  Procedure was terminated.  There were  no complications.  The patient tolerated the procedure well.  ESTIMATED BLOOD LOSS:  5 mL  INTRAOPERATIVE FLUIDS:  200 mL  URINE OUTPUT:  50 mL  DISPOSITION:  The patient was taken to recovery room in good  condition.  TN/NUANCE  D:07/07/2018 T:07/07/2018 JOB:[id number]

## 2018-07-07 NOTE — Brief Op Note (Signed)
07/07/2018  9:48 AM  PATIENT:  Maria Hansen  28 y.o. female  PRE-OPERATIVE DIAGNOSIS:  incomplete abortion  POST-OPERATIVE DIAGNOSIS:  incomplete abortion  PROCEDURE:  Procedure(s): DILATATION AND EVACUATION (N/A)  SURGEON:  Surgeon(s) and Role:    * Schermerhorn, Gwen Her, MD - Primary  PHYSICIAN ASSISTANT:   ASSISTANTS: none   ANESTHESIA:   general  EBL: 5 cc IOF 200 cc UO 50 cc BLOOD ADMINISTERED:none  DRAINS: none   LOCAL MEDICATIONS USED:  NONE  SPECIMEN:  Source of Specimen:  products of conception   DISPOSITION OF SPECIMEN:  PATHOLOGY  COUNTS:  YES  TOURNIQUET:  * No tourniquets in log *  DICTATION: .Other Dictation: Dictation Number verbal  PLAN OF CARE: Discharge to home after PACU  PATIENT DISPOSITION:  PACU - hemodynamically stable.   Delay start of Pharmacological VTE agent (>24hrs) due to surgical blood loss or risk of bleeding: not applicable

## 2018-07-07 NOTE — Progress Notes (Signed)
Pt ready for D+C for inc Abortion . Covid neg . All questions answered   Proceed

## 2018-07-07 NOTE — Transfer of Care (Signed)
Immediate Anesthesia Transfer of Care Note  Patient: Maria Hansen  Procedure(s) Performed: DILATATION AND EVACUATION (N/A Cervix)  Patient Location: PACU  Anesthesia Type:General  Level of Consciousness: awake  Airway & Oxygen Therapy: Patient Spontanous Breathing  Post-op Assessment: Report given to RN  Post vital signs: stable  Last Vitals:  Vitals Value Taken Time  BP 117/63 07/07/18 1007  Temp    Pulse 82 07/07/18 1009  Resp 21 07/07/18 1009  SpO2 100 % 07/07/18 1009  Vitals shown include unvalidated device data.  Last Pain:  Vitals:   07/07/18 0728  TempSrc: Oral  PainSc: 0-No pain         Complications: No apparent anesthesia complications

## 2018-07-08 ENCOUNTER — Encounter: Payer: Self-pay | Admitting: Obstetrics and Gynecology

## 2018-07-08 LAB — SURGICAL PATHOLOGY

## 2018-07-08 NOTE — Anesthesia Postprocedure Evaluation (Signed)
Anesthesia Post Note  Patient: Blakely Maranan  Procedure(s) Performed: DILATATION AND EVACUATION (N/A Cervix)  Patient location during evaluation: PACU Anesthesia Type: General Level of consciousness: awake and alert Pain management: pain level controlled Vital Signs Assessment: post-procedure vital signs reviewed and stable Respiratory status: spontaneous breathing, nonlabored ventilation, respiratory function stable and patient connected to nasal cannula oxygen Cardiovascular status: blood pressure returned to baseline and stable Postop Assessment: no apparent nausea or vomiting Anesthetic complications: no     Last Vitals:  Vitals:   07/07/18 1037 07/07/18 1048  BP: 102/66 104/62  Pulse: (!) 56 68  Resp: (!) 9 14  Temp: (!) 36.3 C (!) 36.2 C  SpO2: 99% 100%    Last Pain:  Vitals:   07/07/18 1048  TempSrc: Temporal  PainSc: 0-No pain                 Molli Barrows

## 2019-06-27 IMAGING — US US ABDOMEN LIMITED
1 series · 14 of 25 positions shown · non-contrast
Comparison: None.

CLINICAL DATA: Abdominal pain with nausea and vomiting. History of
gallstones.

EXAM:
ULTRASOUND ABDOMEN LIMITED RIGHT UPPER QUADRANT

[Series 1: us abdomen limited · 0.23mm/px · 14 of 41 slices shown]
[im 1/41]
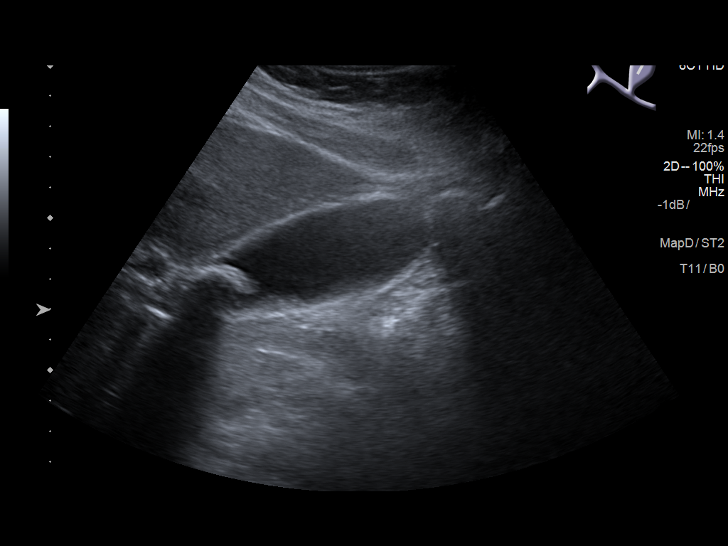
[im 4/41]
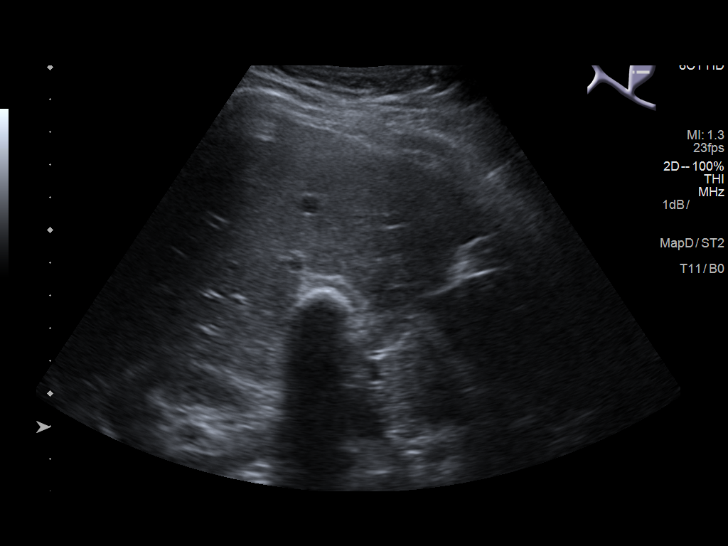
[im 7/41]
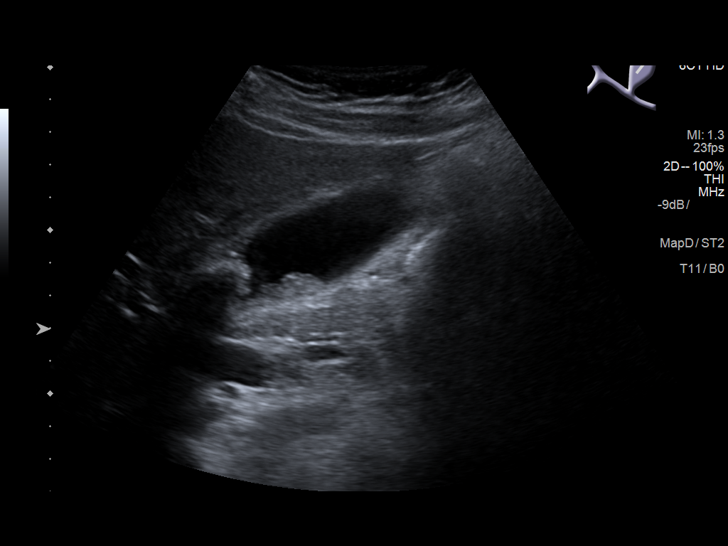
[im 11/41]
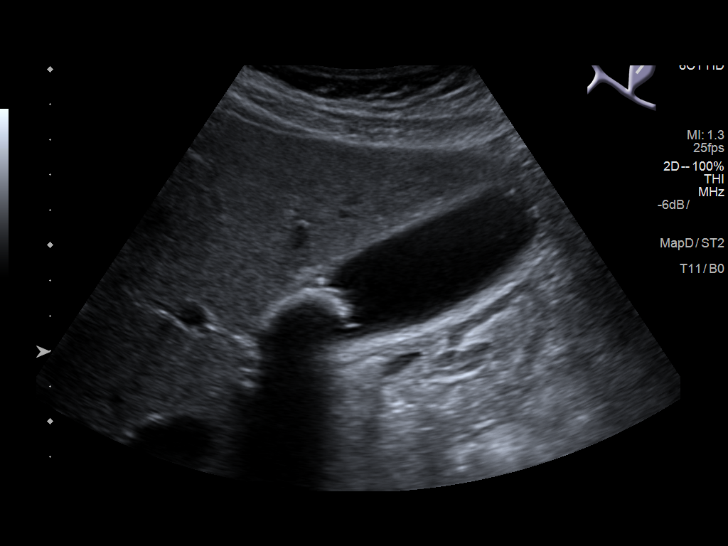
[im 14/41]
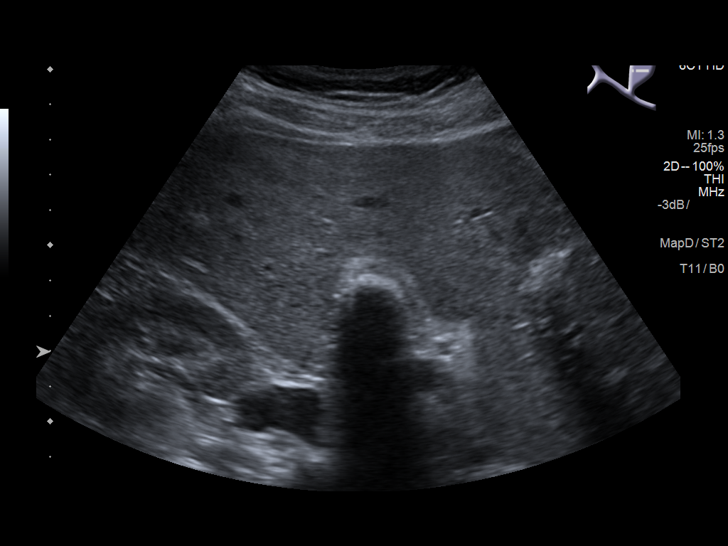
[im 16/41]
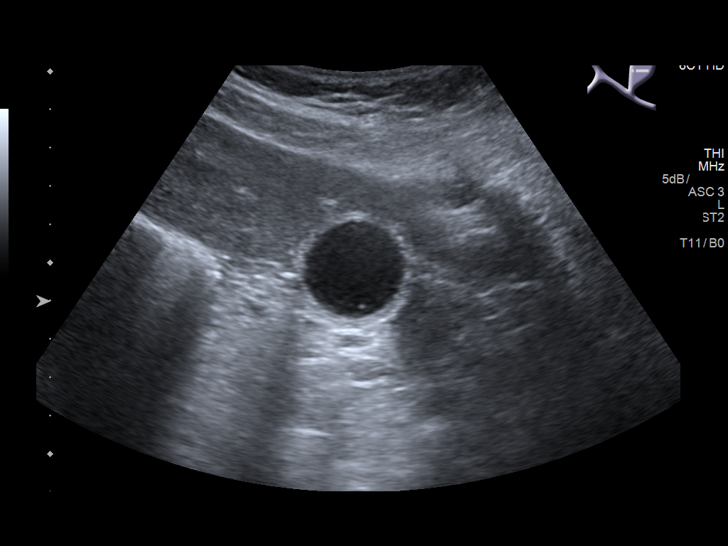
[im 19/41]
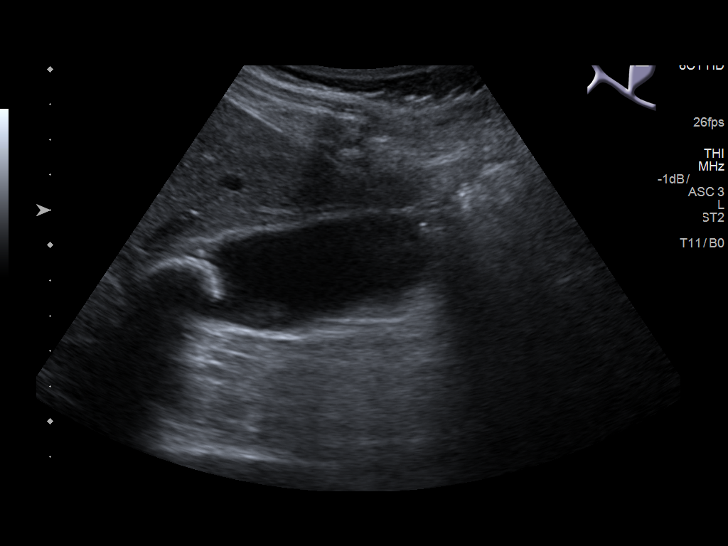
[im 22/41]
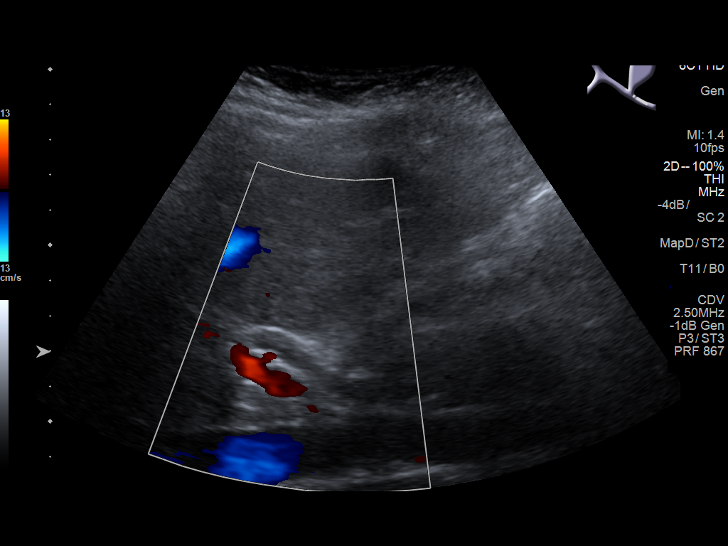
[im 26/41]
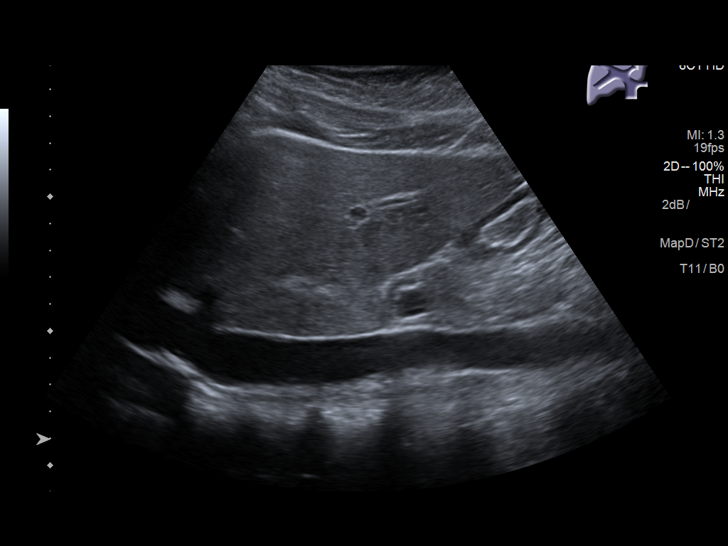
[im 27/41]
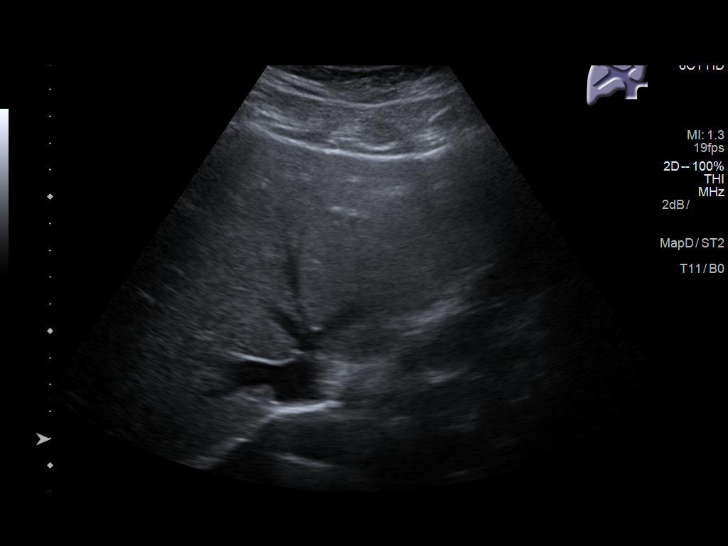
[im 31/41]
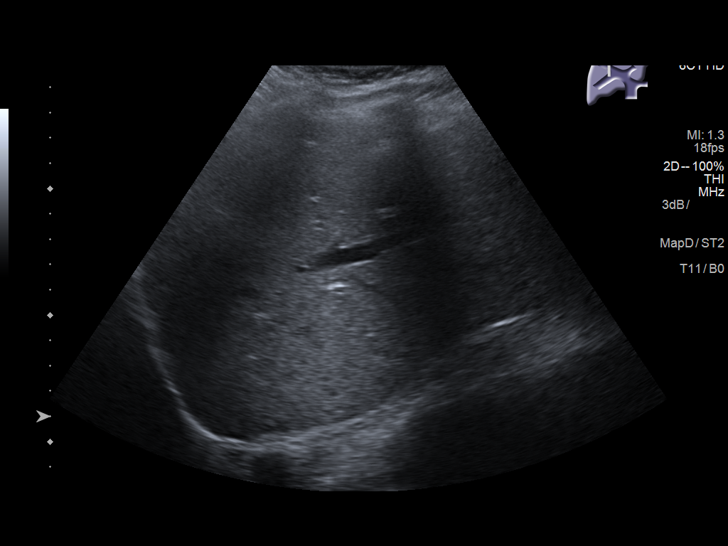
[im 34/41]
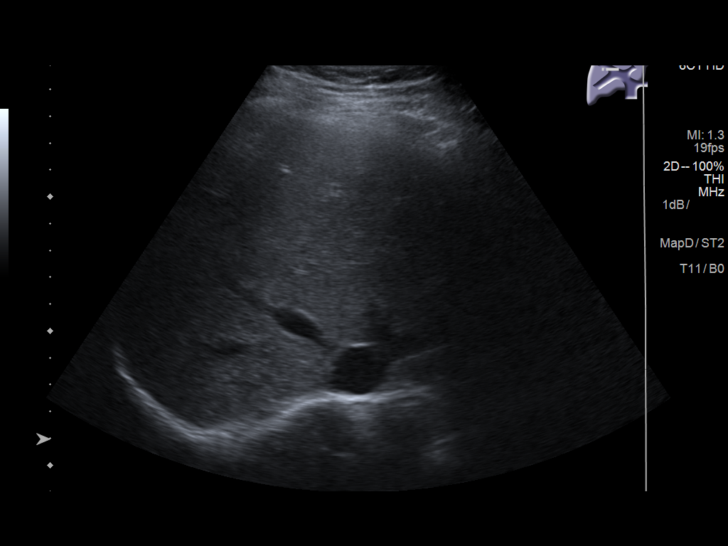
[im 37/41]
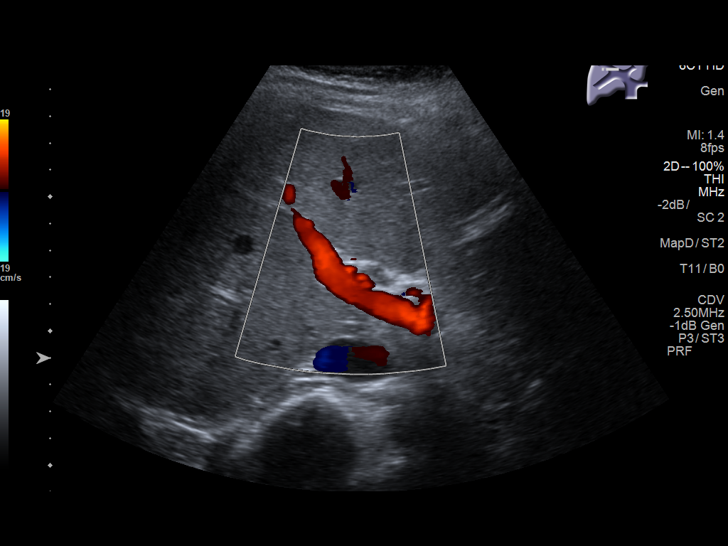
[im 41/41]
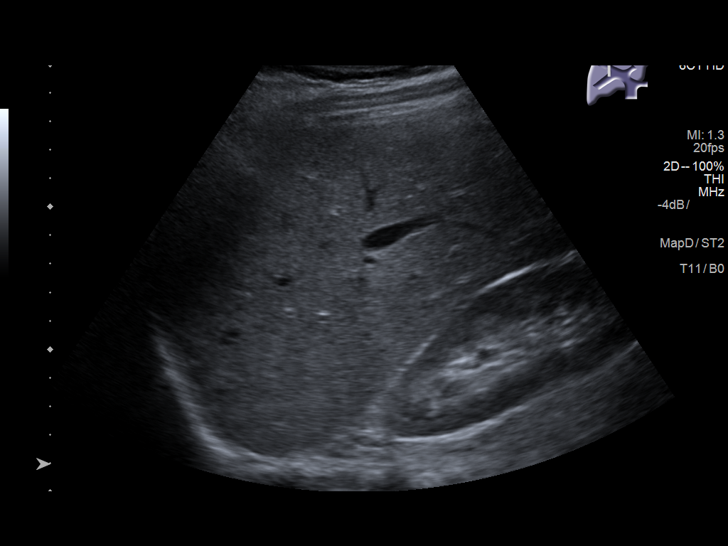

[14 of 25 positions shown; findings below may reference images not displayed]

FINDINGS: Gallbladder:

Shadowing non mobile 2.4 cm stone in the gallbladder neck. Diffuse
gallbladder wall thickening of 4 mm. Intraluminal sludge in
gallbladder. Areas of ring down artifact suggesting adenomyomatosis.
Patient pre-medicated prior to the exam limiting assessment for
sonographic Murphy sign. No pericholecystic fluid.

Common bile duct:

Diameter: 6 mm, upper normal, previously 5 mm.

Liver:

No focal lesion identified. Within normal limits in parenchymal
echogenicity. Portal vein is patent on color Doppler imaging with
normal direction of blood flow towards the liver.
IMPRESSION: 1. Gallstone in the gallbladder neck with gallbladder wall
thickening suspicious for acute cholecystitis. Probable
adenomyomatosis.
2. Upper normal common bile duct but similar to prior exam.

## 2019-11-01 IMAGING — US OBSTETRIC <14 WK ULTRASOUND
1 series · 13 of 28 positions shown · non-contrast
Comparison: 02/21/2018

CLINICAL DATA: Vaginal bleeding status post abortion.

EXAM:
OBSTETRIC <14 WK US AND TRANSVAGINAL OB US
TECHNIQUE: Both transabdominal and transvaginal ultrasound examinations were
performed for complete evaluation of the gestation as well as the
maternal uterus, adnexal regions, and pelvic cul-de-sac.
Transvaginal technique was performed to assess early pregnancy.

[Series 1: obstetric <14 wk ultrasound · 13 of 116 slices shown]
[im 5/116]
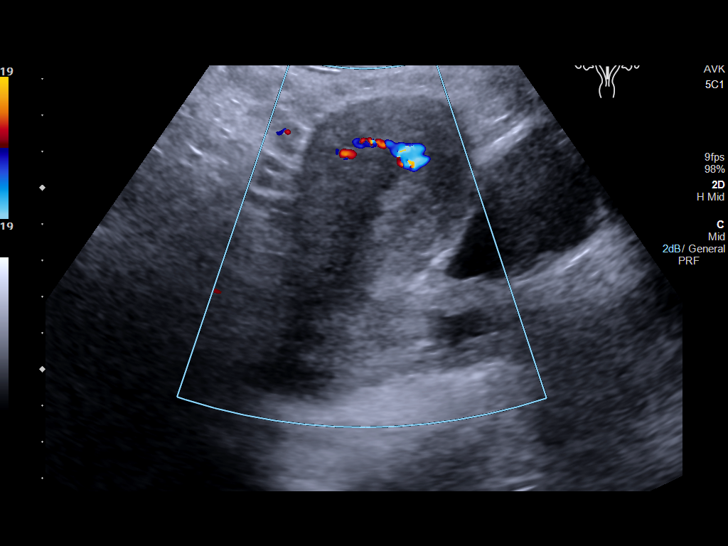
[im 13/116]
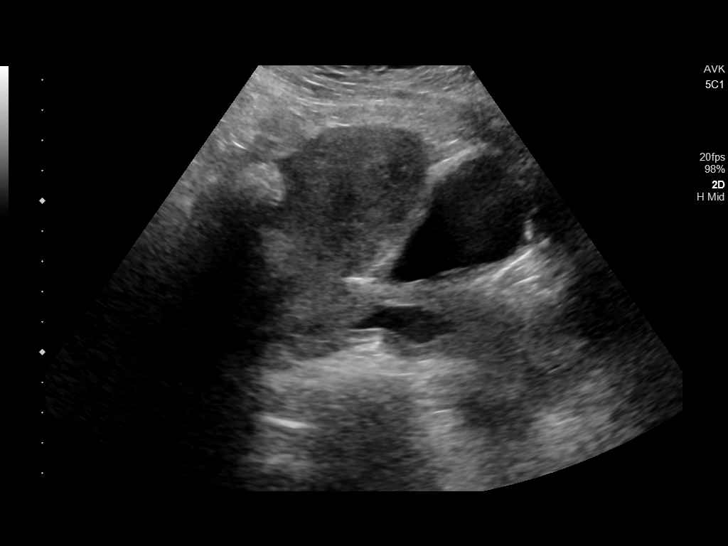
[im 22/116]
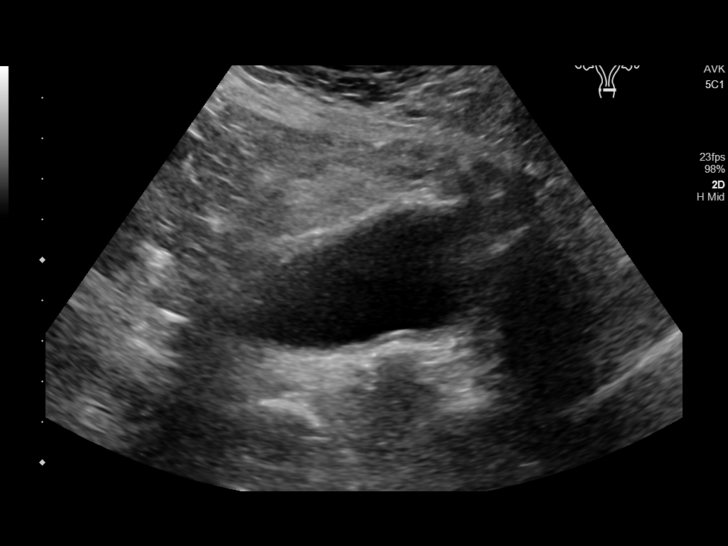
[im 30/116]
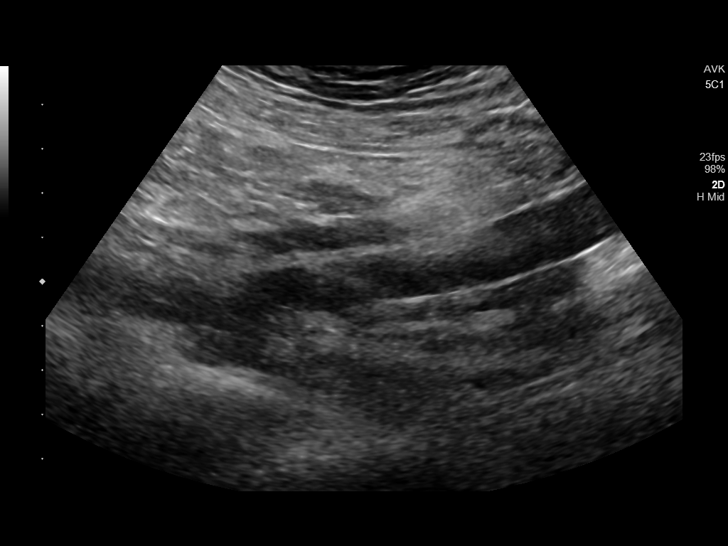
[im 39/116]
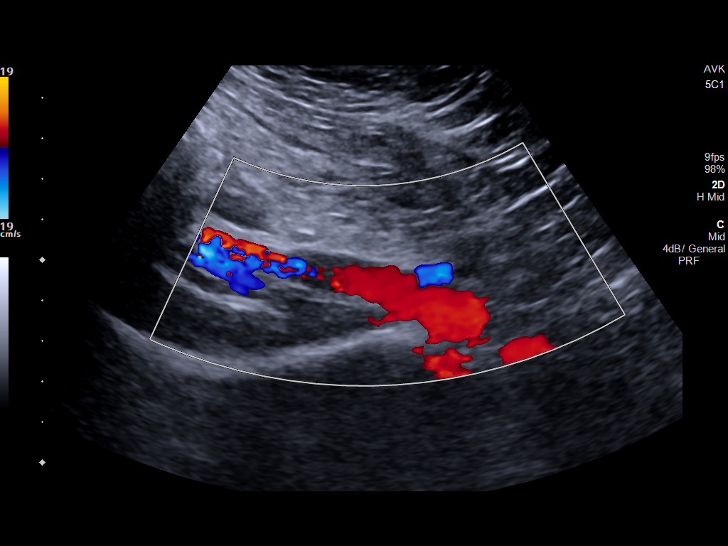
[im 47/116]
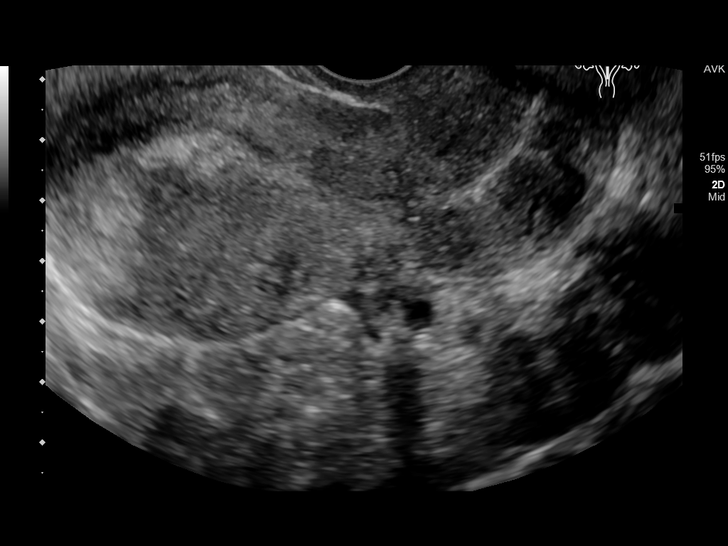
[im 60/116]
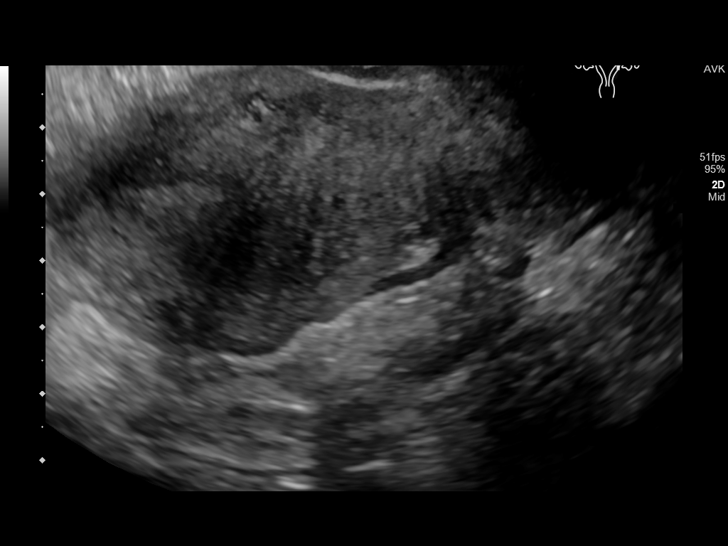
[im 69/116]
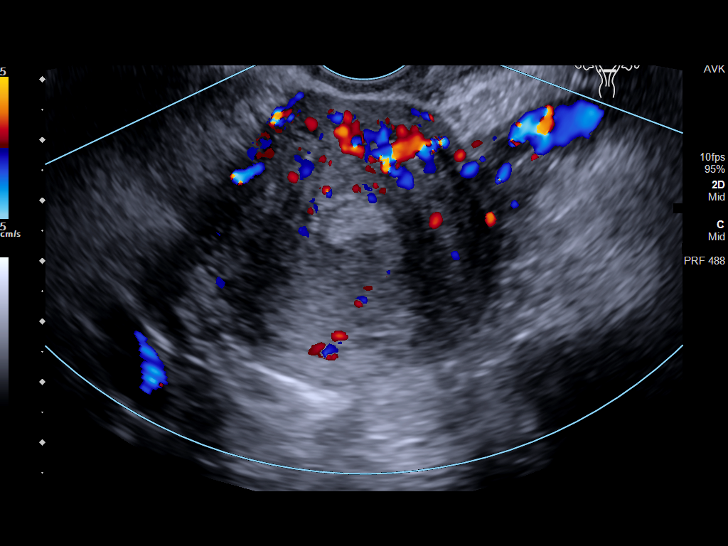
[im 77/116]
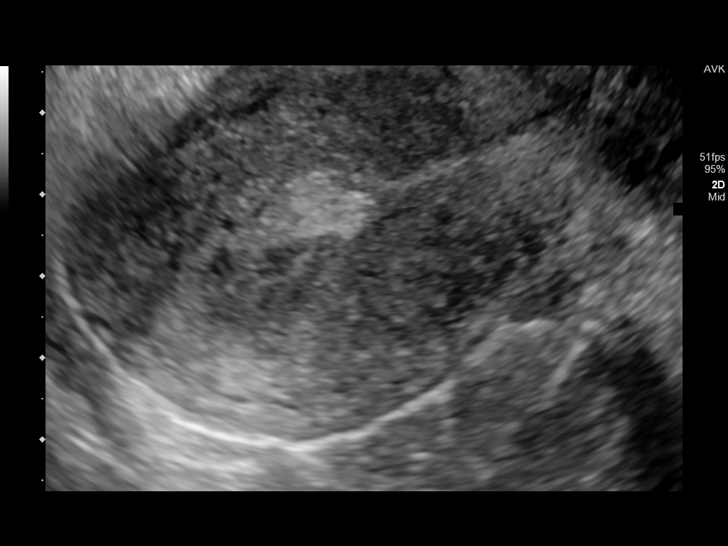
[im 86/116]
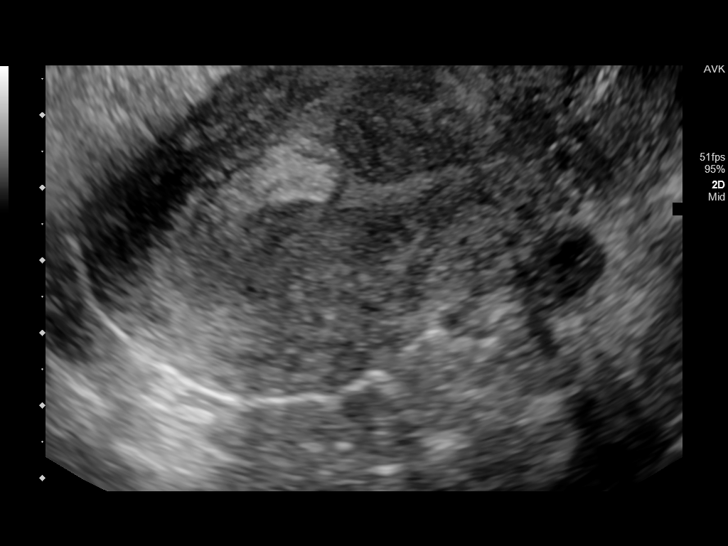
[im 94/116]
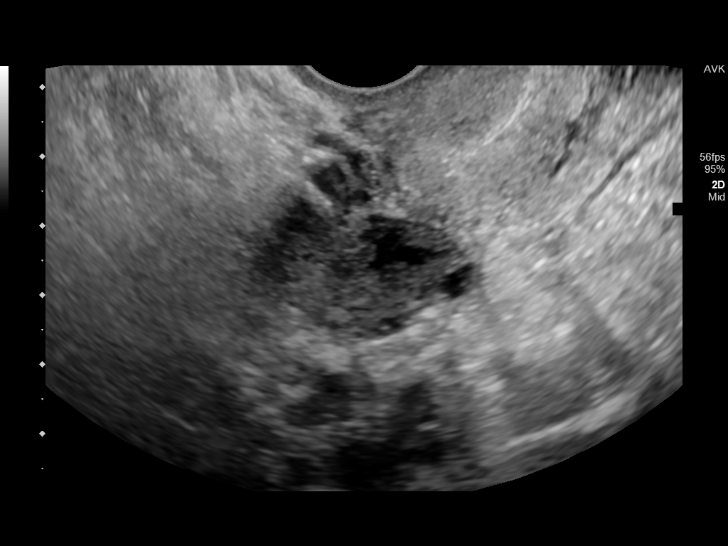
[im 103/116]
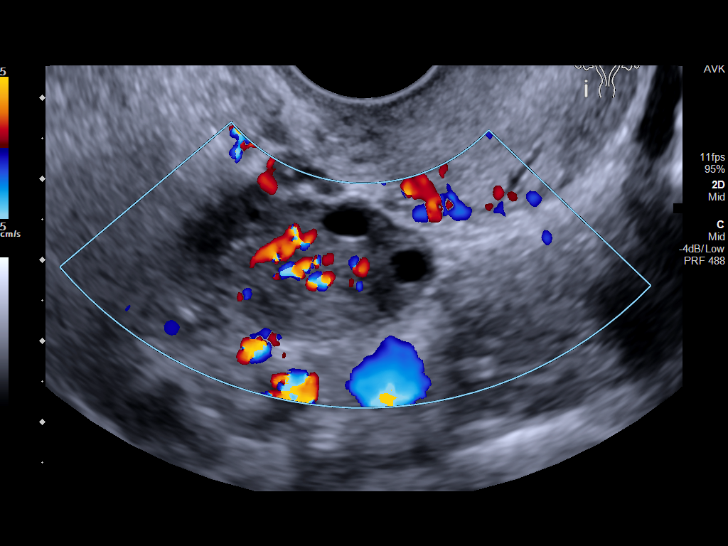
[im 111/116]
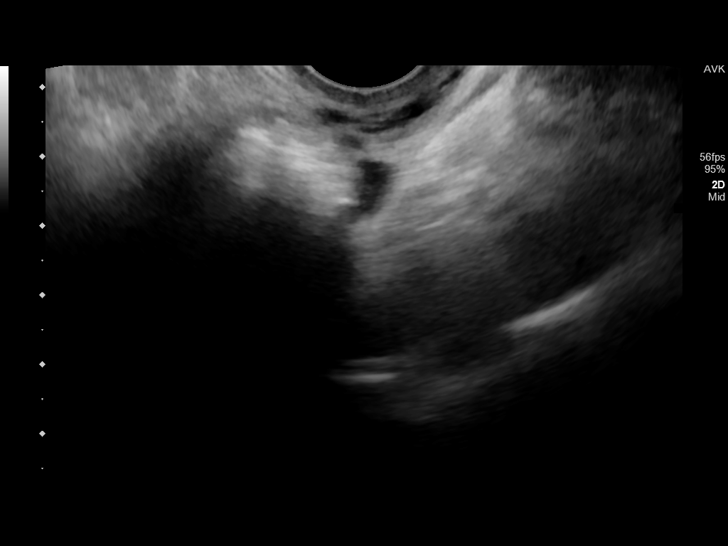

[13 of 28 positions shown; findings below may reference images not displayed]

FINDINGS: Intrauterine gestational sac: None

Yolk sac:  Not Visualized.

Embryo:  Not Visualized.

Cardiac Activity: Not Visualized.

Subchorionic hemorrhage:  None visualized.

Maternal uterus/adnexae: The right ovary is unremarkable. The left
ovary is not visualized. There is some focal thickening of the
endometrium measuring up to approximately 11 mm. There are few
surrounding cystic areas. There is a trace amount of free fluid in
the pelvis.
IMPRESSION: 1. No IUP identified.
2. Focal thickening of the endometrium measuring up to approximately
11 mm with small surrounding cystic areas is nonspecific.
Differential considerations include retained products of conception.
Follow-up is recommended to confirm resolution of these findings.
3. Left ovary was not well visualized on this exam.
4. Trace amount of free fluid in the pelvis.

## 2019-12-04 ENCOUNTER — Ambulatory Visit: Payer: Commercial Managed Care - PPO | Admitting: Surgery

## 2020-02-02 ENCOUNTER — Other Ambulatory Visit: Payer: Self-pay | Admitting: Orthopedic Surgery

## 2020-02-02 DIAGNOSIS — S73192A Other sprain of left hip, initial encounter: Secondary | ICD-10-CM

## 2020-02-13 ENCOUNTER — Ambulatory Visit: Payer: Commercial Managed Care - PPO

## 2020-02-29 ENCOUNTER — Ambulatory Visit: Payer: Commercial Managed Care - PPO

## 2020-02-29 ENCOUNTER — Other Ambulatory Visit: Payer: Commercial Managed Care - PPO

## 2020-03-22 ENCOUNTER — Other Ambulatory Visit: Payer: Self-pay

## 2020-03-22 ENCOUNTER — Ambulatory Visit
Admission: RE | Admit: 2020-03-22 | Discharge: 2020-03-22 | Disposition: A | Discharge status: Home / Self Care | Payer: Commercial Managed Care - PPO | Source: Ambulatory Visit | Attending: Orthopedic Surgery | Admitting: Orthopedic Surgery

## 2020-03-22 DIAGNOSIS — M25552 Pain in left hip: Secondary | ICD-10-CM | POA: Diagnosis not present

## 2020-03-22 DIAGNOSIS — S73192A Other sprain of left hip, initial encounter: Secondary | ICD-10-CM

## 2020-03-22 DIAGNOSIS — Y939 Activity, unspecified: Secondary | ICD-10-CM | POA: Diagnosis not present

## 2020-03-22 DIAGNOSIS — Y929 Unspecified place or not applicable: Secondary | ICD-10-CM | POA: Insufficient documentation

## 2020-03-22 MED ORDER — LIDOCAINE HCL (PF) 1 % IJ SOLN
5.0000 mL | Freq: Once | INTRAMUSCULAR | Status: AC
  Administered 2020-03-22: 5 mL
  Filled 2020-03-22: qty 5

## 2020-03-22 MED ORDER — IOHEXOL 180 MG/ML  SOLN
13.0000 mL | Freq: Once | INTRAMUSCULAR | Status: AC | PRN
  Administered 2020-03-22: 6 mL via INTRA_ARTICULAR

## 2020-03-22 MED ORDER — SODIUM CHLORIDE (PF) 0.9 % IJ SOLN
7.0000 mL | Freq: Once | INTRAMUSCULAR | Status: AC
  Administered 2020-03-22: 6 mL

## 2020-03-22 MED ORDER — GADOBUTROL 1 MMOL/ML IV SOLN
0.0500 mL | Freq: Once | INTRAVENOUS | Status: AC | PRN
  Administered 2020-03-22: 0.05 mL

## 2020-06-05 ENCOUNTER — Ambulatory Visit: Payer: Commercial Managed Care - PPO | Admitting: Podiatry

## 2020-12-02 DIAGNOSIS — O034 Incomplete spontaneous abortion without complication: Secondary | ICD-10-CM | POA: Insufficient documentation

## 2020-12-02 DIAGNOSIS — G8929 Other chronic pain: Secondary | ICD-10-CM | POA: Insufficient documentation

## 2021-09-30 ENCOUNTER — Emergency Department: Payer: Commercial Managed Care - PPO

## 2021-09-30 ENCOUNTER — Other Ambulatory Visit: Payer: Self-pay

## 2021-09-30 ENCOUNTER — Emergency Department
Admission: EM | Admit: 2021-09-30 | Discharge: 2021-10-01 | Disposition: A | Discharge status: Home / Self Care | Payer: Commercial Managed Care - PPO | Attending: Emergency Medicine | Admitting: Emergency Medicine

## 2021-09-30 DIAGNOSIS — M545 Low back pain, unspecified: Secondary | ICD-10-CM | POA: Diagnosis not present

## 2021-09-30 LAB — POC URINE PREG, ED: Preg Test, Ur: NEGATIVE

## 2021-09-30 MED ORDER — BACLOFEN 10 MG PO TABS: 10.0000 mg | ORAL_TABLET | Freq: Three times a day (TID) | ORAL | 0 refills | Status: AC

## 2021-09-30 MED ORDER — METHYLPREDNISOLONE 4 MG PO TBPK: ORAL_TABLET | ORAL | 0 refills | Status: DC
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
## 2021-09-30 NOTE — Discharge Instructions (Signed)
Follow-up with your regular doctor as needed.  Follow-up with orthopedics if not improving with medication.  Apply ice to the lower back.  Return if worsening
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
## 2023-05-26 ENCOUNTER — Ambulatory Visit
Admission: EM | Admit: 2023-05-26 | Discharge: 2023-05-26 | Disposition: A | Discharge status: Home / Self Care | Attending: Family Medicine | Admitting: Family Medicine

## 2023-05-26 DIAGNOSIS — J0100 Acute maxillary sinusitis, unspecified: Secondary | ICD-10-CM | POA: Diagnosis not present

## 2023-05-26 MED ORDER — AMOXICILLIN-POT CLAVULANATE 875-125 MG PO TABS: 1.0000 | ORAL_TABLET | Freq: Two times a day (BID) | ORAL | 0 refills | Status: DC

## 2023-05-26 NOTE — ED Provider Notes (Signed)
 [facility]    CSN: [id number] Arrival date & time: 05/26/23  1522      History   Chief Complaint Chief Complaint  Patient presents with   Facial Pain    HPI Maria Hansen is a 33 y.o. female.   HPI  History obtained from the patient. Maria Hansen presents for concern ongoing nasal congestion for the past 2 weeks. Took Tylenol , Mucinex, Theraflu for pain. Has to blow her nose constantly.  Has right sided facial pain. Endorses headaches. Has thick blood tinged discharge. No fever, cough, vomiting or diarrhea.      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      Home Medications    Prior to Admission medications   Medication Sig Start Date End Date Taking? Authorizing Provider  amoxicillin -clavulanate (AUGMENTIN ) 875-125 MG tablet Take 1 tablet by mouth every 12 (twelve) hours. 05/26/23  Yes Tamsin Nader, DO  acetaminophen  (TYLENOL ) 500 MG tablet Take 500 mg by mouth every 6 (six) hours as needed (for pain.).    [provider]    Family History Family History  Problem Relation Age of Onset   Gallbladder disease  Mother    Hypertension Father     Social History Social History   Tobacco Use   Smoking status: Former    Current packs/day: 0.25    Types: Cigarettes   Smokeless tobacco: Never  Vaping Use   Vaping status: Every Day  Substance Use Topics   Alcohol use: No   Drug use: No     Allergies   Patient has no known allergies.   Review of Systems Review of Systems: negative unless otherwise stated in HPI.      Physical Exam Triage Vital Signs ED Triage Vitals  Encounter Vitals Group     BP      Systolic BP Percentile      Diastolic BP Percentile      Pulse      Resp      Temp      Temp src      SpO2      Weight      Height      Head Circumference      Peak Flow      Pain Score      Pain Loc      Pain Education      Exclude from Growth Chart    No data found.  Updated Vital Signs BP 118/82 (BP Location: Left Arm)   Pulse 77   Temp 99.7 F (37.6 C) (Oral)   Resp 16   LMP 05/17/2023 (Approximate)   SpO2 97%  Visual Acuity Right Eye Distance:   Left Eye Distance:   Bilateral Distance:    Right Eye Near:   Left Eye Near:    Bilateral Near:     Physical Exam GEN:     alert, non-toxic appearing female in no distress    HENT:  mucus membranes moist, oropharyngeal without lesions or erythema, no tonsillar hypertrophy or exudates,  moderate erythematous edematous turbinates, bloody thick nasal discharge, maxillary sinus tenderness, no frontal sinus tenderness EYES:   no scleral injection or discharge NECK:  normal ROM, no lymphadenopathy, no meningismus   RESP:  no increased work of breathing Skin:   warm and dry    UC Treatments / Results  Labs (all labs ordered are listed, but only abnormal results are displayed) Labs Reviewed - No data to display  EKG   Radiology No results found.  Procedures Procedures (including critical care time)  Medications Ordered in UC Medications - No data to display  Initial Impression / Assessment and Plan /  UC Course  I have reviewed the triage vital signs and the nursing notes.  Pertinent labs & imaging results that were available during my care of the patient were reviewed by me and considered in my medical decision making (see chart for details).       Pt is a 34 y.o. female who presents for 2 weeks of nasal congestion that is not improving.  Maria Hansen is  afebrile here without recent antipyretics. Satting adequately on room air. Overall pt is  non-toxic appearing, well hydrated, without respiratory distress.  On exam, she has erythematous turbinates that are edematous with thick nasal bloody discharge.  History and exam concerning for acute maxillary sinusitis.  Treat with antibiotics as below.   COVID  and influenza testing deferred due to length of symptoms.  Typical duration of symptoms discussed. Return and ED precautions given and patient voiced understanding.   Discussed MDM, treatment plan and plan for follow-up with patient who agrees with plan.     Final Clinical Impressions(s) / UC Diagnoses   Final diagnoses:  Acute non-recurrent maxillary sinusitis     Discharge Instructions      I suspect you have a sinus infection. Stop by the pharmacy to pick up your prescriptions.  Follow up with your primary care provider or return to the urgent care, if not improving.       ED Prescriptions     Medication Sig Dispense Auth. Provider   amoxicillin -clavulanate (AUGMENTIN ) 875-125 MG tablet Take 1 tablet by mouth every 12 (twelve) hours. 14 tablet Sheron Robin, DO      PDMP not reviewed this encounter.   Fidel Huddle, DO 05/28/23 1842

## 2023-05-26 NOTE — Discharge Instructions (Addendum)
 I suspect you have a sinus infection. Stop by the pharmacy to pick up your prescriptions.  Follow up with your primary care provider or return to the urgent care, if not improving.

## 2023-05-26 NOTE — ED Triage Notes (Signed)
 Sinus pain and congestion, right ear pain, congestion x 2 weeks. Tried Theraflu, mucinex and tylenol  with no relief of symptoms.

## 2024-02-08 ENCOUNTER — Ambulatory Visit: Admitting: Family Medicine

## 2024-02-08 VITALS — BP 120/79 | HR 79 | Temp 97.4°F | Resp 16 | Ht 62.0 in | Wt 180.6 lb

## 2024-02-08 DIAGNOSIS — F32A Depression, unspecified: Secondary | ICD-10-CM | POA: Diagnosis not present

## 2024-02-08 DIAGNOSIS — M65331 Trigger finger, right middle finger: Secondary | ICD-10-CM

## 2024-02-08 DIAGNOSIS — K219 Gastro-esophageal reflux disease without esophagitis: Secondary | ICD-10-CM | POA: Diagnosis not present

## 2024-02-08 DIAGNOSIS — R0683 Snoring: Secondary | ICD-10-CM | POA: Insufficient documentation

## 2024-02-08 NOTE — Progress Notes (Signed)
 "  New Patient Office Visit  Subjective    Patient ID: Maria Hansen, female    DOB: 10-29-1990  Age: 34 y.o. MRN: [id number]  CC:  Chief Complaint  Patient presents with   Establish Care    Pt. Here to establish care. Pt. C/o of having low iron and severe heart burn. Low iron being going on after pregnancy for 6 years and heart been started after gallbladder removal in 2021. Pt. Has a bump on the Rt ring finger that feels discomfort.  Pt denies pain at this time.    HPI Tavi Gaughran presents to establish care Discussed the use of AI scribe software for clinical note transcription with the patient, who gave verbal consent to proceed.  History of Present Illness   Maria Hansen is a 34 year old female who presents with severe heartburn and symptoms suggestive of sleep apnea.  She experiences severe heartburn, which she describes as 'really, really bad.' She is currently taking omeprazole with magnesium for this condition. Her heartburn began during her pregnancy and worsened after her cholecystectomy a few months post-delivery in June 2019. She consumed Tums throughout her pregnancy to manage the symptoms. Her diet includes a lot of spicy foods, and she drinks sodas and coffee, which may exacerbate her symptoms.  She reports snoring and waking up exhausted. She has not yet undergone a sleep study. She is unsure if there is a family history of sleep apnea. She feels tired upon waking.  She has been diagnosed with rosacea, which began two to three years ago. She uses metronidazole cream and Rho Fade cream to manage her symptoms. Her rosacea worsens in the summertime but has improved over time. She can go a few days without medication if necessary.  She experiences constipation as a side effect of her medication, which she manages by increasing her fiber intake.  She has a history of carpal tunnel syndrome in her right hand, which causes pain when writing. Her current job involves  some physical activity, which may contribute to her symptoms.  She has a nodule at the base of her right ring finger that is painful.  She denies triggering.  Her vitamin D levels are on the low side, measured at 32, and she has been prescribed vitamin D by her dermatologist. Her B12 levels are normal at 522, and her thyroid function is normal.     Outpatient Encounter Medications as of 02/08/2024  Medication Sig   ketoconazole (NIZORAL) 2 % shampoo Apply topically 3 (three) times a week.   metroNIDAZOLE (METROCREAM) 0.75 % cream Apply topically 2 (two) times daily.   Vitamin D, Ergocalciferol, (DRISDOL) 1.25 MG (50000 UNIT) CAPS capsule Take 50,000 Units by mouth 3 (three) times a week.   acetaminophen  (TYLENOL ) 500 MG tablet Take 500 mg by mouth every 6 (six) hours as needed (for pain.).   etonogestrel (NEXPLANON) 68 MG IMPL implant Inject 1 each into the skin.   RHOFADE 1 % CREA SMARTSIG:1 Topical Every Morning   [DISCONTINUED] amoxicillin -clavulanate (AUGMENTIN ) 875-125 MG tablet Take 1 tablet by mouth every 12 (twelve) hours.   No facility-administered encounter medications on file as of 02/08/2024.    Past Medical History:  Diagnosis Date   Acid reflux disease    Calculus of gallbladder with acute and chronic cholecystitis without obstruction 02/22/2018    Past Surgical History:  Procedure Laterality Date   CHOLECYSTECTOMY N/A 02/22/2018   Procedure: LAPAROSCOPIC CHOLECYSTECTOMY;  Surgeon: Nicholaus Selinda Birmingham, MD;  Location: [facility] ORS;  Service: General;  Laterality: N/A;   DILATION AND CURETTAGE OF UTERUS  2013, 2011,   DILATION AND EVACUATION N/A 07/07/2018   Procedure: DILATATION AND EVACUATION;  Surgeon: Schermerhorn, Debby PARAS, MD;  Location: [facility] ORS;  Service: Gynecology;  Laterality: N/A;   KNEE SURGERY Left     Family History  Problem Relation Age of Onset   Gallbladder disease Mother    Hypertension Father     Social History   Socioeconomic History   Marital status:  Single    Spouse name: Not on file   Number of children: Not on file   Years of education: Not on file   Highest education level: Not on file  Occupational History   Not on file  Tobacco Use   Smoking status: Former    Current packs/day: 0.25    Types: Cigarettes   Smokeless tobacco: Never  Vaping Use   Vaping status: Every Day  Substance and Sexual Activity   Alcohol use: No   Drug use: No   Sexual activity: Not on file  Other Topics Concern   Not on file  Social History Narrative   Not on file   Social Drivers of Health   Tobacco Use: Medium Risk (07/06/2023)   Received [facility]   Patient History    Smoking Tobacco Use: Former    Smokeless Tobacco Use: Never    Passive Exposure: Not on file  Financial Resource Strain: Low Risk (02/25/2021)   Received from [facility]   Overall Financial Resource Strain (CARDIA)    Difficulty of Paying Living Expenses: Not hard at all  Food Insecurity: No Food Insecurity (05/25/2023)   Received from [facility]   Epic    Within the past 12 months, you worried that your food would run out before you got the money to buy more.: Never true    Within the past 12 months, the food you bought just didn't last and you didn't have money to get more.: Never true  Transportation Needs: No Transportation Needs (05/25/2023)   Received from [facility]   PRAPARE - Transportation    Lack of Transportation (Medical): No    Lack of Transportation (Non-Medical): No  Physical Activity: Not on file  Stress: Not on file  Social Connections: Not on file  Intimate Partner Violence: Not on file  Depression (PHQ2-9): High Risk (02/08/2024)   Depression (PHQ2-9)    PHQ-2 Score: 11  Alcohol Screen: Not on file  Housing: Not on file  Utilities: Low Risk (05/25/2023)   Received from [facility]   Utilities    Within the past 12 months, have you been unable to get utilities(heat, electricity) when it was really needed?: No  Health  Literacy: Not on file    ROS      Objective   BP 120/79 (Cuff Size: Normal)   Pulse 79   Temp (!) 97.4 F (36.3 C) (Oral)   Resp 16   Ht 5' 2 (1.575 m)   Wt 180 lb 9.6 oz (81.9 kg)   SpO2 97%   BMI 33.03 kg/m    Physical Exam Vitals and nursing note reviewed.  Constitutional:      Appearance: Normal appearance.  HENT:     Head: Normocephalic and atraumatic.  Eyes:     Conjunctiva/sclera: Conjunctivae normal.  Cardiovascular:     Rate and Rhythm: Normal rate and regular rhythm.  Pulmonary:     Effort: Pulmonary effort is normal.  Breath sounds: Normal breath sounds.  Musculoskeletal:        General: Swelling (nodule base of rihgt ring finger) present.     Right lower leg: No edema.     Left lower leg: No edema.  Skin:    General: Skin is warm and dry.  Neurological:     Mental Status: She is alert and oriented to person, place, and time.  Psychiatric:        Mood and Affect: Mood normal.        Behavior: Behavior normal.        Thought Content: Thought content normal.        Judgment: Judgment normal.            The ASCVD Risk score (Arnett DK, et al., 2019) failed to calculate for the following reasons:   The 2019 ASCVD risk score is only valid for ages 66 to 57   * - Cholesterol units were assumed     Assessment & Plan:  Trigger middle finger of right hand Assessment & Plan: She has a nodule in the tendon shealth of the ring finger right hand.  Will refer to Hand Specialist  Orders: -     Ambulatory referral to Hand Surgery  Snores Assessment & Plan: Mallampati score is 4.  Epworth sleepiness score is 11.  Referral to Pulmonary.  She is exhausted all the time despite sleep 8 hours.    Orders: -     Pulmonary Visit  Gastroesophageal reflux disease, unspecified whether esophagitis present Assessment & Plan: Pantoprazole 20mg  BID and FOLLOW-UP in a month.  Avoid sodas, tomato based foods and spicy foods.  Will taper off as she  improves   Depression, unspecified depression type Assessment & Plan: Her PHQ-9 score is 11 but she denies depression.  She denies sadness or feeling down and below.     Return in about 4 weeks (around 03/07/2024).   Guthrie Lemme K Jayceion Lisenby, MD  "

## 2024-02-08 NOTE — Assessment & Plan Note (Signed)
 Her PHQ-9 score is 11 but she denies depression.  She denies sadness or feeling down and below.

## 2024-02-08 NOTE — Assessment & Plan Note (Addendum)
 Mallampati score is 4.  Epworth sleepiness score is 11.  Referral to Pulmonary.  She is exhausted all the time despite sleep 8 hours.

## 2024-02-08 NOTE — Assessment & Plan Note (Signed)
 She has a nodule in the tendon shealth of the ring finger right hand.  Will refer to Hand Specialist

## 2024-02-08 NOTE — Assessment & Plan Note (Signed)
 Pantoprazole 20mg  BID and FOLLOW-UP in a month.  Avoid sodas, tomato based foods and spicy foods.  Will taper off as she improves

## 2024-02-21 ENCOUNTER — Ambulatory Visit: Admitting: Sleep Medicine

## 2024-02-28 ENCOUNTER — Ambulatory Visit: Admitting: Sleep Medicine

## 2024-03-03 ENCOUNTER — Ambulatory Visit: Admitting: Sleep Medicine
